# Patient Record
Sex: Female | Born: 1960 | Race: White | Hispanic: No | Smoking: Never smoker
Health system: Southern US, Community
[De-identification: ages and names within clinical notes are randomized; demographics above are authoritative.]

## PROBLEM LIST (undated history)

## (undated) DIAGNOSIS — N83209 Unspecified ovarian cyst, unspecified side: Secondary | ICD-10-CM

## (undated) DIAGNOSIS — B351 Tinea unguium: Secondary | ICD-10-CM

## (undated) DIAGNOSIS — K589 Irritable bowel syndrome without diarrhea: Secondary | ICD-10-CM

## (undated) HISTORY — DX: Tinea unguium: B35.1

## (undated) HISTORY — DX: Unspecified ovarian cyst, unspecified side: N83.209

## (undated) HISTORY — PX: BREAST BIOPSY: SHX20

## (undated) HISTORY — PX: POLYPECTOMY: SHX149

## (undated) HISTORY — DX: Irritable bowel syndrome, unspecified: K58.9

## (undated) HISTORY — PX: OVARIAN CYST SURGERY: SHX726

---

## 1997-10-07 ENCOUNTER — Inpatient Hospital Stay (HOSPITAL_COMMUNITY): Admission: AD | Admit: 1997-10-07 | Discharge: 1997-10-07 | Payer: Self-pay | Admitting: Obstetrics and Gynecology

## 1999-09-06 ENCOUNTER — Other Ambulatory Visit: Admission: RE | Admit: 1999-09-06 | Discharge: 1999-09-06 | Payer: Self-pay | Admitting: Family Medicine

## 2003-05-12 ENCOUNTER — Emergency Department (HOSPITAL_COMMUNITY): Admission: EM | Admit: 2003-05-12 | Discharge: 2003-05-12 | Payer: Self-pay | Admitting: Emergency Medicine

## 2004-01-14 ENCOUNTER — Emergency Department (HOSPITAL_COMMUNITY): Admission: EM | Admit: 2004-01-14 | Discharge: 2004-01-14 | Payer: Self-pay | Admitting: Family Medicine

## 2004-02-07 ENCOUNTER — Encounter: Payer: Self-pay | Admitting: Family Medicine

## 2005-05-09 ENCOUNTER — Other Ambulatory Visit: Admission: RE | Admit: 2005-05-09 | Discharge: 2005-05-09 | Payer: Self-pay | Admitting: Obstetrics & Gynecology

## 2008-01-11 ENCOUNTER — Encounter: Payer: Self-pay | Admitting: Family Medicine

## 2009-05-21 ENCOUNTER — Encounter (INDEPENDENT_AMBULATORY_CARE_PROVIDER_SITE_OTHER): Payer: Self-pay | Admitting: *Deleted

## 2009-06-12 ENCOUNTER — Ambulatory Visit: Payer: Self-pay | Admitting: Family Medicine

## 2009-06-12 ENCOUNTER — Other Ambulatory Visit: Admission: RE | Admit: 2009-06-12 | Discharge: 2009-06-12 | Payer: Self-pay | Admitting: Family Medicine

## 2009-06-12 DIAGNOSIS — Z8719 Personal history of other diseases of the digestive system: Secondary | ICD-10-CM | POA: Insufficient documentation

## 2009-06-12 DIAGNOSIS — N390 Urinary tract infection, site not specified: Secondary | ICD-10-CM | POA: Insufficient documentation

## 2009-06-12 DIAGNOSIS — N83209 Unspecified ovarian cyst, unspecified side: Secondary | ICD-10-CM | POA: Insufficient documentation

## 2009-06-19 ENCOUNTER — Encounter (INDEPENDENT_AMBULATORY_CARE_PROVIDER_SITE_OTHER): Payer: Self-pay | Admitting: *Deleted

## 2009-06-25 ENCOUNTER — Encounter: Admission: RE | Admit: 2009-06-25 | Discharge: 2009-06-25 | Payer: Self-pay | Admitting: Family Medicine

## 2009-06-28 ENCOUNTER — Ambulatory Visit: Payer: Self-pay | Admitting: Family Medicine

## 2009-06-29 LAB — CONVERTED CEMR LAB
ALT: 12 units/L (ref 0–35)
AST: 16 units/L (ref 0–37)
Albumin: 4.4 g/dL (ref 3.5–5.2)
Alkaline Phosphatase: 47 units/L (ref 39–117)
BUN: 8 mg/dL (ref 6–23)
Basophils Absolute: 0 10*3/uL (ref 0.0–0.1)
Basophils Relative: 0.8 % (ref 0.0–3.0)
Bilirubin, Direct: 0.1 mg/dL (ref 0.0–0.3)
CO2: 28 meq/L (ref 19–32)
Calcium: 9.2 mg/dL (ref 8.4–10.5)
Chloride: 104 meq/L (ref 96–112)
Cholesterol: 154 mg/dL (ref 0–200)
Creatinine, Ser: 0.8 mg/dL (ref 0.4–1.2)
Eosinophils Absolute: 0.1 10*3/uL (ref 0.0–0.7)
Eosinophils Relative: 2.1 % (ref 0.0–5.0)
GFR calc non Af Amer: 81.62 mL/min (ref >60)
Glucose, Bld: 77 mg/dL (ref 70–99)
HCT: 42.1 % (ref 36.0–46.0)
HDL: 49.3 mg/dL (ref >39.00)
Hemoglobin: 13.9 g/dL (ref 12.0–15.0)
LDL Cholesterol: 88 mg/dL (ref 0–99)
Lymphocytes Relative: 32.1 % (ref 12.0–46.0)
Lymphs Abs: 1.8 10*3/uL (ref 0.7–4.0)
MCHC: 33 g/dL (ref 30.0–36.0)
MCV: 89.5 fL (ref 78.0–100.0)
Monocytes Absolute: 0.4 10*3/uL (ref 0.1–1.0)
Monocytes Relative: 7.7 % (ref 3.0–12.0)
Neutro Abs: 3.4 10*3/uL (ref 1.4–7.7)
Neutrophils Relative %: 57.3 % (ref 43.0–77.0)
Platelets: 222 10*3/uL (ref 150.0–400.0)
Potassium: 4.2 meq/L (ref 3.5–5.1)
RBC: 4.71 M/uL (ref 3.87–5.11)
RDW: 12.5 % (ref 11.5–14.6)
Sodium: 138 meq/L (ref 135–145)
TSH: 1.52 microintl units/mL (ref 0.35–5.50)
Total Bilirubin: 0.7 mg/dL (ref 0.3–1.2)
Total CHOL/HDL Ratio: 3
Total Protein: 7.6 g/dL (ref 6.0–8.3)
Triglycerides: 85 mg/dL (ref 0.0–149.0)
VLDL: 17 mg/dL (ref 0.0–40.0)
WBC: 5.7 10*3/uL (ref 4.5–10.5)

## 2010-06-27 ENCOUNTER — Ambulatory Visit: Admit: 2010-06-27 | Payer: Self-pay | Admitting: Family Medicine

## 2010-07-09 NOTE — Assessment & Plan Note (Signed)
Summary: NEW TO EST,WANTS CPX,[employer] INS,NS FEE/RH.....   Vital Signs:  Patient profile:   50 year old female Height:      68.75 inches Weight:      178 pounds BMI:     26.57 Temp:     98.2 degrees F Pulse rate:   82 / minute Pulse rhythm:   regular BP sitting:   122 / 80  (left arm) Cuff size:   regular  Vitals Entered By: Army Fossa CMA (June 12, 2009 2:12 PM) CC: To establish, CPX, pap   History of Present Illness: Pt here to establish and have CPE.  Pt with hx ovarian cysts and IBS.    Preventive Screening-Counseling & Management  Alcohol-Tobacco     Alcohol drinks/day: 0     Smoking Status: never  Caffeine-Diet-Exercise     Caffeine use/day: 3     Does Patient Exercise: no  Hep-HIV-STD-Contraception     Dental Visit-last 6 months yes     Dental Care Counseling: not indicated; dental care within six months     SBE monthly: no     SBE Education/Counseling: not applicable      Drug Use:  never and no.    Current Medications (verified): 1)  Black Cohosh Root 540 Mg Caps (Black Cohosh) 2)  Coenzyme Q10 10 Mg Caps (Coenzyme Q10) 3)  Super Calcium 600 + Soy 600-200-25 Mg-Unit-Mg Tabs (Calcium Carb-Vit D-Soy Isoflav) 4)  Cranberry Plus Vitamin C 140-100-3 Mg-Mg-Unit Caps (Cranberry-Vitamin C-Vitamin E) 5)  Centrum Silver Ultra Womens  Tabs (Multiple Vitamins-Minerals) 6)  Aspir-Low 81 Mg Tbec (Aspirin) 7)  Penlac 8 % Soln (Ciclopirox) .... Use As Directed 8)  Macrobid 100 Mg Caps (Nitrofurantoin Monohyd Macro) .Marland Kitchen.. 1 By Mouth Once Daily As Needed 9)  Midol Maximum Strength 500-32.4-14.9 Mg Tabs (Aspirin-Cinnamedrine-Caffeine) 10)  Tylenol Pm Extra Strength 500-25 Mg Tabs (Diphenhydramine-Apap (Sleep))  Allergies (verified): No Known Drug Allergies  Past History:  Family History: Last updated: 06/12/2009 Galbladder cancer -M M--IBS Family History Diabetes 1st degree relative Family History of CAD Female 1st degree relative <60 Family History of CAD Female  1st degree relative <50  Social History: Last updated: 06/12/2009 Single Alcohol use-no Drug use-no Regular exercise-no Never Smoked  Risk Factors: Alcohol Use: 0 (06/12/2009) Caffeine Use: 3 (06/12/2009) Exercise: no (06/12/2009)  Past Medical History: Current Problems:  URINARY TRACT INFECTION, RECURRENT (ICD-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 Galbladder cancer -M M--IBS Family History Diabetes 1st degree relative Family History of CAD Female 1st degree relative <60 Family History of CAD Female 1st degree relative <50  Social History: Reviewed history and no changes required. Single Alcohol use-no Drug use-no Regular exercise-no Never Smoked Drug Use:  never, no Does Patient Exercise:  no Smoking Status:  never Caffeine use/day:  3 Dental Care w/in 6 mos.:  yes  Review of Systems      See HPI General:  Denies chills, fatigue, fever, loss of appetite, malaise, sleep disorder, sweats, weakness, and weight loss. Eyes:  Denies blurring, discharge, double vision, eye irritation, eye pain, halos, itching, light sensitivity, red eye, vision loss-1 eye, and vision loss-both eyes. ENT:  Denies decreased hearing, difficulty swallowing, ear discharge, earache, hoarseness, nasal  congestion, nosebleeds, postnasal drainage, ringing in ears, sinus pressure, and sore throat. CV:  Denies bluish discoloration of lips or nails, chest pain or discomfort, difficulty breathing at night, difficulty breathing while lying down, fainting, fatigue, leg cramps with exertion, lightheadness, near fainting, palpitations, shortness of breath  with exertion, swelling of feet, swelling of hands, and weight gain. Resp:  Denies chest discomfort, chest pain with inspiration, cough, coughing up blood, excessive snoring, hypersomnolence, morning headaches, pleuritic, shortness of breath, sputum productive, and wheezing. GI:  Complains of change in bowel habits; alternating constipation and diarrhea. GU:  Denies abnormal vaginal bleeding, decreased libido, discharge, dysuria, genital sores, hematuria, incontinence, nocturia, urinary frequency, and urinary hesitancy; no symptoms now but pt with frequent uti. MS:  Denies joint pain, joint redness, joint swelling, loss of strength, low back pain, mid back pain, muscle aches, muscle , cramps, muscle weakness, stiffness, and thoracic pain. Derm:  Denies changes in color of skin, changes in nail beds, dryness, excessive perspiration, flushing, hair loss, insect bite(s), itching, lesion(s), poor wound healing, and rash. Neuro:  Denies brief paralysis, difficulty with concentration, disturbances in coordination, falling down, headaches, inability to speak, memory loss, numbness, poor balance, seizures, sensation of room spinning, tingling, tremors, visual disturbances, and weakness. Psych:  Denies alternate hallucination ( auditory/visual), anxiety, depression, easily angered, easily tearful, irritability, mental problems, panic attacks, sense of great danger, suicidal thoughts/plans, thoughts of violence, unusual visions or sounds, and thoughts /plans of harming others. Endo:  Denies cold intolerance, excessive hunger, excessive thirst, excessive urination, heat intolerance, polyuria, and weight change. Heme:  Denies abnormal bruising, bleeding, enlarge lymph nodes, fevers, pallor, and skin discoloration. Allergy:  Denies hives or rash, itching eyes, persistent infections, seasonal allergies, and sneezing.  Physical Exam  General:  Well-developed,well-nourished,in no acute distress; alert,appropriate and  cooperative throughout examination Head:  Normocephalic and atraumatic without obvious abnormalities. No apparent alopecia or balding. Eyes:  vision grossly intact, pupils equal, pupils round, pupils reactive to light, and no injection.   Ears:  External ear exam shows no significant lesions or deformities.  Otoscopic examination reveals clear canals, tympanic membranes are intact bilaterally without bulging, retraction, inflammation or discharge. Hearing is grossly normal bilaterally. Nose:  External nasal examination shows no deformity or inflammation. Nasal mucosa are pink and moist without lesions or exudates. Mouth:  Oral mucosa and oropharynx without lesions or exudates.  Teeth in good repair. Neck:  No deformities, masses, or tenderness noted. Chest Wall:  No deformities, masses, or tenderness noted. Breasts:  No mass, nodules, thickening, tenderness, bulging, retraction, inflamation, nipple discharge or skin changes noted.   Lungs:  Normal respiratory effort, chest expands symmetrically. Lungs are clear to auscultation, no crackles or wheezes. Heart:  normal rate and no murmur.   Abdomen:  Bowel sounds positive,abdomen soft and non-tender without masses, organomegaly or hernias noted. Rectal:  No external abnormalities noted. Normal sphincter tone. No rectal masses or tenderness. Genitalia:  Pelvic Exam:        External: normal female genitalia without lesions or masses        Vagina: normal without lesions or masses        Cervix: normal without lesions or masses        Adnexa: normal bimanual exam without masses or fullness        Uterus: normal by palpation        Pap smear: performed Msk:  normal ROM, no joint tenderness, no joint swelling, no joint warmth, no redness over joints, no joint  deformities, no joint instability, and no crepitation.   Pulses:  R posterior tibial normal, R dorsalis pedis normal, R carotid normal, L posterior tibial normal, L dorsalis pedis normal, and L  carotid normal.   Extremities:  No clubbing, cyanosis, edema, or deformity noted with normal full range of motion of all joints.   Neurologic:  No cranial nerve deficits noted. Station and gait are normal. Plantar reflexes are down-going bilaterally. DTRs are symmetrical throughout. Sensory, motor and coordinative functions appear intact. Skin:  Intact without suspicious lesions or rashes Cervical Nodes:  No lymphadenopathy noted Axillary Nodes:  No palpable lymphadenopathy Psych:  Cognition and judgment appear intact. Alert and cooperative with normal attention span and concentration. No apparent delusions, illusions, hallucinations   Impression & Recommendations:  Problem # 1:  PREVENTIVE HEALTH CARE (ICD-V70.0) check labs pt to schedule mammo sbe  Orders: EKG w/ Interpretation (93000)  Problem # 2:  IRRITABLE BOWEL SYNDROME, HX OF (ICD-V12.79) never w/u --- pt assumed it was ibs because so many people in family have it.  Pt having no problems now advised her to try align---- consider GI if symptoms return Orders: EKG w/ Interpretation (93000)  Problem # 3:  URINARY TRACT INFECTION, RECURRENT (ICD-599.0)  pt has seen urology in past--- will get records Her updated medication list for this problem includes:    Macrobid 100 Mg Caps (Nitrofurantoin monohyd macro) .Marland Kitchen... 1 by mouth once daily as needed  Encouraged to push clear liquids, get enough rest, and take acetaminophen as needed. To be seen in 10 days if no improvement, sooner if worse.  Orders: EKG w/ Interpretation (93000)  Complete Medication List: 1)  Black Cohosh Root 540 Mg Caps (Black cohosh) 2)  Coenzyme Q10 10 Mg Caps (Coenzyme q10) 3)  Super Calcium 600 + Soy 600-200-25 Mg-unit-mg Tabs (Calcium carb-vit d-soy isoflav) 4)  Cranberry Plus Vitamin C 140-100-3 Mg-mg-unit Caps (Cranberry-vitamin c-vitamin e) 5)  Centrum Silver Ultra Womens Tabs (Multiple vitamins-minerals) 6)  Aspir-low 81 Mg Tbec (Aspirin) 7)   Penlac 8 % Soln (Ciclopirox) .... Use as directed 8)  Macrobid 100 Mg Caps (Nitrofurantoin monohyd macro) .Marland Kitchen.. 1 by mouth once daily as needed 9)  Midol Maximum Strength 500-32.4-14.9 Mg Tabs (Aspirin-cinnamedrine-caffeine) 10)  Tylenol Pm Extra Strength 500-25 Mg Tabs (Diphenhydramine-apap (sleep))  Patient Instructions: 1)  fasting labs  v70.0  cbcd, bmp, hep,lipid, tsh Prescriptions: MACROBID 100 MG CAPS (NITROFURANTOIN MONOHYD MACRO) 1 by mouth once daily as needed  #30 x 1   Entered and Authorized by:   Loreen Freud DO   Signed by:   Loreen Freud DO on 06/12/2009   Method used:   Electronically to        [facility]  [address] [id number] (ret[facility])       [address]       [place], [place]  [address]       Ph: [phone] or [phone]       Fax: [phone]   RxID:   [id number] PENLAC 8 % SOLN (CICLOPIROX) use as directed  #6.6 x 2   Entered and Authorized by:   Loreen Freud DO   Signed by:   Loreen Freud DO on 06/12/2009   Method used:   Electronically to        [facility]  [address] [id number] ([facility]       [address]       [facility] [place], [place]  [address]       Ph: [phone] or [phone]       Fax: [phone]   RxID:   [id number]    EKG  Procedure date:  06/12/2009  Findings:      Normal sinus rhythm with rate of:  69 bpm    Immunization History:  Tetanus/Td Immunization History:    Tetanus/Td:  Historical (06/23/2001)   Flu Vaccine Next Due:  Refused TD Result Date:  06/23/2001 TD Result:  given

## 2010-07-09 NOTE — Letter (Signed)
Summary: [facility]  [facility]   Imported By: Lanelle Bal 07/06/2009 10:14:39  _____________________________________________________________________  External Attachment:    Type:   Image     Comment:   External Document

## 2010-07-09 NOTE — Letter (Signed)
Summary: Results Follow up Letter  [facility]  [address] [place], [place] [address]   Phone: [phone]  Fax: [phone]    06/19/2009 MRN: [id number]  JATARA HUETTNER [address] [place], [place]  [address]  Dear Ms. TEST,  The following are the results of your recent test(s):  Test         Result    Pap Smear:        Normal __X___  Not Normal _____ Comments: ______________________________________________________ Cholesterol: LDL(Bad cholesterol):         Your goal is less than:         HDL (Good cholesterol):       Your goal is more than: Comments:  ______________________________________________________ Mammogram:        Normal _____  Not Normal _____ Comments:  ___________________________________________________________________ Hemoccult:        Normal _____  Not normal _______ Comments:    _____________________________________________________________________ Other Tests:    We routinely do not discuss normal results over the telephone.  If you desire a copy of the results, or you have any questions about this information we can discuss them at your next office visit.   Sincerely,    Army Fossa CMA  June 19, 2009 11:12 AM

## 2010-07-09 NOTE — Letter (Signed)
Summary: New Patient Letter  [facility]  [address] [place], [place] [address]   Phone: [phone]  Fax: [phone]       05/21/2009 MRN: [id number]  Emily Mejia [address] [place], [place]  [address]  Dear Ms. [name],   Welcome to [facility] and thank you for choosing [place] as your Primary Care Providers. Enclosed you will find information about our practice that we hope you find helpful. We have also enclosed forms to be filled out prior to your visit. This will provide [place] with the necessary information and facilitate your being seen in a timely manner. If you have any questions, please call us at: [phone] and we will be happy to assist you. We look forward to seeing you at your scheduled appointment time.  Appointment Tuesday, June 12, 2009 at 2:15pm  with Dr. Loreen Freud   Sincerely,  Primary Health Care Team  Please arrive 15 minutes early for your first appointment and bring your insurance card. Co-pay is required at the time of your visit.  *****Please call the office if you are not able to keep this appointment. There is a charge of $50.00 if any appointment is not cancelled or rescheduled within 24 hours*****

## 2010-07-19 ENCOUNTER — Other Ambulatory Visit: Payer: Self-pay | Admitting: Family Medicine

## 2010-07-19 ENCOUNTER — Encounter: Payer: Self-pay | Admitting: Family Medicine

## 2010-07-19 ENCOUNTER — Encounter (INDEPENDENT_AMBULATORY_CARE_PROVIDER_SITE_OTHER): Payer: BC Managed Care – PPO | Admitting: Family Medicine

## 2010-07-19 ENCOUNTER — Encounter (INDEPENDENT_AMBULATORY_CARE_PROVIDER_SITE_OTHER): Payer: Self-pay | Admitting: *Deleted

## 2010-07-19 ENCOUNTER — Other Ambulatory Visit (HOSPITAL_COMMUNITY)
Admission: RE | Admit: 2010-07-19 | Discharge: 2010-07-19 | Disposition: A | Discharge status: Home / Self Care | Payer: BC Managed Care – PPO | Source: Ambulatory Visit | Attending: Family Medicine | Admitting: Family Medicine

## 2010-07-19 DIAGNOSIS — Z0000 Encounter for general adult medical examination without abnormal findings: Secondary | ICD-10-CM

## 2010-07-19 DIAGNOSIS — Z833 Family history of diabetes mellitus: Secondary | ICD-10-CM

## 2010-07-19 DIAGNOSIS — E162 Hypoglycemia, unspecified: Secondary | ICD-10-CM

## 2010-07-19 DIAGNOSIS — Z01419 Encounter for gynecological examination (general) (routine) without abnormal findings: Secondary | ICD-10-CM | POA: Insufficient documentation

## 2010-07-19 LAB — BASIC METABOLIC PANEL
BUN: 11 mg/dL (ref 6–23)
CO2: 28 mEq/L (ref 19–32)
Calcium: 9.2 mg/dL (ref 8.4–10.5)
Chloride: 104 mEq/L (ref 96–112)
Creatinine, Ser: 0.7 mg/dL (ref 0.4–1.2)
GFR: 90.31 mL/min (ref >60.00)
Glucose, Bld: 78 mg/dL (ref 70–99)
Potassium: 4.5 mEq/L (ref 3.5–5.1)
Sodium: 139 mEq/L (ref 135–145)

## 2010-07-19 LAB — CBC WITH DIFFERENTIAL/PLATELET
Basophils Absolute: 0 10*3/uL (ref 0.0–0.1)
Basophils Relative: 0.6 % (ref 0.0–3.0)
Eosinophils Absolute: 0.1 10*3/uL (ref 0.0–0.7)
Eosinophils Relative: 1.4 % (ref 0.0–5.0)
HCT: 42.3 % (ref 36.0–46.0)
Hemoglobin: 14.6 g/dL (ref 12.0–15.0)
Lymphocytes Relative: 27.9 % (ref 12.0–46.0)
Lymphs Abs: 2.1 10*3/uL (ref 0.7–4.0)
MCHC: 34.4 g/dL (ref 30.0–36.0)
MCV: 85.6 fl (ref 78.0–100.0)
Monocytes Absolute: 0.6 10*3/uL (ref 0.1–1.0)
Monocytes Relative: 8.2 % (ref 3.0–12.0)
Neutro Abs: 4.8 10*3/uL (ref 1.4–7.7)
Neutrophils Relative %: 61.9 % (ref 43.0–77.0)
Platelets: 248 10*3/uL (ref 150.0–400.0)
RBC: 4.95 Mil/uL (ref 3.87–5.11)
RDW: 13.7 % (ref 11.5–14.6)
WBC: 7.7 10*3/uL (ref 4.5–10.5)

## 2010-07-19 LAB — LIPID PANEL
Cholesterol: 165 mg/dL (ref 0–200)
HDL: 46 mg/dL (ref >39.00)
LDL Cholesterol: 107 mg/dL — ABNORMAL HIGH (ref 0–99)
Total CHOL/HDL Ratio: 4
Triglycerides: 60 mg/dL (ref 0.0–149.0)
VLDL: 12 mg/dL (ref 0.0–40.0)

## 2010-07-19 LAB — HEPATIC FUNCTION PANEL
ALT: 12 U/L (ref 0–35)
AST: 16 U/L (ref 0–37)
Albumin: 4.2 g/dL (ref 3.5–5.2)
Alkaline Phosphatase: 45 U/L (ref 39–117)
Bilirubin, Direct: 0.1 mg/dL (ref 0.0–0.3)
Total Bilirubin: 0.7 mg/dL (ref 0.3–1.2)
Total Protein: 7.2 g/dL (ref 6.0–8.3)

## 2010-07-19 LAB — TSH: TSH: 1.19 u[IU]/mL (ref 0.35–5.50)

## 2010-07-19 LAB — HEMOGLOBIN A1C: Hgb A1c MFr Bld: 5.5 % (ref 4.6–6.5)

## 2010-07-25 NOTE — Assessment & Plan Note (Signed)
Summary: cpx&pap/rh   Vital Signs:  Patient profile:   50 year old female Menstrual status:  regular LMP:     06/27/2010 Height:      68.5 inches Weight:      192.2 pounds BMI:     28.90 Pulse rate:   71 / minute Pulse rhythm:   regular BP sitting:   132 / 81  Vitals Entered By: Almeta Monas CMA Duncan Dull) (July 19, 2010 8:27 AM) CC: Cpx/fasting with pap LMP (date): 06/27/2010     Menstrual Status regular Enter LMP: 06/27/2010 Last PAP Result NEGATIVE FOR INTRAEPITHELIAL LESIONS OR MALIGNANCY. BENIGN REACTIVE/REPARATIVE CHANGES.   History of Present Illness: Pt here for cpe,  pap and labs.   No complaints.    Preventive Screening-Counseling & Management  Alcohol-Tobacco     Alcohol drinks/day: 0     Smoking Status: never  Caffeine-Diet-Exercise     Caffeine use/day: 3     Does Patient Exercise: no     Exercise Counseling: to improve exercise regimen  Hep-HIV-STD-Contraception     Dental Visit-last 6 months yes     Dental Care Counseling: not indicated; dental care within six months     SBE monthly: no     SBE Education/Counseling: not applicable      Sexual History:  single.    Problems Prior to Update: 1)  Urinary Tract Infection, Recurrent  (ICD-599.0) 2)  Preventive Health Care  (ICD-V70.0) 3)  Family History of Cad Female 1st Degree Relative <50  (ICD-V17.3) 4)  Family History of Cad Female 1st Degree Relative <60  (ICD-V16.49) 5)  Family History Diabetes 1st Degree Relative  (ICD-V18.0) 6)  Ovarian Cyst  (ICD-620.2) 7)  Irritable Bowel Syndrome, Hx of  (ICD-V12.79)  Medications Prior to Update: 1)  Black Cohosh Root 540 Mg Caps (Black Cohosh) 2)  Coenzyme Q10 10 Mg Caps (Coenzyme Q10) 3)  Super Calcium 600 + Soy 600-200-25 Mg-Unit-Mg Tabs (Calcium Carb-Vit D-Soy Isoflav) 4)  Cranberry Plus Vitamin C 140-100-3 Mg-Mg-Unit Caps (Cranberry-Vitamin C-Vitamin E) 5)  Centrum Silver Ultra Womens  Tabs (Multiple Vitamins-Minerals) 6)  Aspir-Low 81 Mg Tbec  (Aspirin) 7)  Penlac 8 % Soln (Ciclopirox) .... Use As Directed 8)  Macrobid 100 Mg Caps (Nitrofurantoin Monohyd Macro) .Marland Kitchen.. 1 By Mouth Once Daily As Needed 9)  Midol Maximum Strength 500-32.4-14.9 Mg Tabs (Aspirin-Cinnamedrine-Caffeine) 10)  Tylenol Pm Extra Strength 500-25 Mg Tabs (Diphenhydramine-Apap (Sleep))  Current Medications (verified): 1)  Coenzyme Q10 10 Mg Caps (Coenzyme Q10) 2)  Super Calcium 600 + Soy 600-200-25 Mg-Unit-Mg Tabs (Calcium Carb-Vit D-Soy Isoflav) 3)  Cranberry Plus Vitamin C 140-100-3 Mg-Mg-Unit Caps (Cranberry-Vitamin C-Vitamin E) 4)  Centrum Silver Ultra Womens  Tabs (Multiple Vitamins-Minerals) 5)  Aspir-Low 81 Mg Tbec (Aspirin) 6)  Penlac 8 % Soln (Ciclopirox) .... Use As Directed 7)  Macrobid 100 Mg Caps (Nitrofurantoin Monohyd Macro) .Marland Kitchen.. 1 By Mouth Once Daily As Needed 8)  Vitamin D 1000 Unit Tabs (Cholecalciferol) .... By Mouth Once Daily 9)  Folic Acid 200 Mcg Tabs (Folic Acid) .... By Mouth Once Daily  Allergies (verified): No Known Drug Allergies  Past History:  Past Medical History: Last updated: 06/12/2009 Current Problems:  URINARY TRACT INFECTION, RECURRENT (ICD-599.0) PREVENTIVE HEALTH CARE (ICD-V70.0) FAMILY HISTORY OF CAD FEMALE 1ST DEGREE RELATIVE <50 (ICD-V17.3) FAMILY HISTORY OF CAD FEMALE 1ST DEGREE RELATIVE <60 (ICD-V16.49) FAMILY HISTORY DIABETES 1ST DEGREE RELATIVE (ICD-V18.0) OVARIAN CYST (ICD-620.2) IRRITABLE BOWEL SYNDROME, HX OF (ICD-V12.79)  Past Surgical History: Last updated: 06/12/2009 Ovarian cysts  Family History: Last updated: 06/12/2009 Galbladder cancer -M M--IBS Family History Diabetes 1st degree relative Family History of CAD Female 1st degree relative <60 Family History of CAD Female 1st degree relative <50  Social History: Last updated: 06/12/2009 Single Alcohol use-no Drug use-no Regular exercise-no Never Smoked  Risk Factors: Alcohol Use: 0 (07/19/2010) Caffeine Use: 3 (07/19/2010) Exercise:  no (07/19/2010)  Risk Factors: Smoking Status: never (07/19/2010)  Family History: Reviewed history from 06/12/2009 and no changes required. Galbladder cancer -M M--IBS Family History Diabetes 1st degree relative Family History of CAD Female 1st degree relative <60 Family History of CAD Female 1st degree relative <50  Social History: Reviewed history from 06/12/2009 and no changes required. Single Alcohol use-no Drug use-no Regular exercise-no Never Smoked Sexual History:  single  Review of Systems      See HPI General:  Denies chills, fatigue, fever, loss of appetite, malaise, sleep disorder, sweats, weakness, and weight loss. Eyes:  Denies blurring, discharge, double vision, eye irritation, eye pain, halos, itching, light sensitivity, red eye, vision loss-1 eye, and vision loss-both eyes; optho q2y. ENT:  Denies decreased hearing, difficulty swallowing, ear discharge, earache, hoarseness, nasal congestion, nosebleeds, postnasal drainage, ringing in ears, sinus pressure, and sore throat. CV:  Denies bluish discoloration of lips or nails, chest pain or discomfort, difficulty breathing at night, difficulty breathing while lying down, fainting, fatigue, leg cramps with exertion, lightheadness, near fainting, palpitations, shortness of breath with exertion, swelling of feet, swelling of hands, and weight gain. Resp:  Denies chest discomfort, chest pain with inspiration, cough, coughing up blood, excessive snoring, hypersomnolence, morning headaches, pleuritic, shortness of breath, sputum productive, and wheezing. GI:  Denies abdominal pain, bloody stools, change in bowel habits, constipation, dark tarry stools, diarrhea, excessive appetite, gas, hemorrhoids, indigestion, loss of appetite, nausea, vomiting, vomiting blood, and yellowish skin color. GU:  Denies abnormal vaginal bleeding, decreased libido, discharge, dysuria, genital sores, hematuria, incontinence, nocturia, urinary frequency,  and urinary hesitancy. MS:  Denies joint pain, joint redness, joint swelling, loss of strength, low back pain, mid back pain, muscle aches, muscle , cramps, muscle weakness, stiffness, and thoracic pain. Derm:  Denies changes in color of skin, changes in nail beds, dryness, excessive perspiration, flushing, hair loss, insect bite(s), itching, lesion(s), poor wound healing, and rash. Neuro:  Denies brief paralysis, difficulty with concentration, disturbances in coordination, falling down, headaches, inability to speak, memory loss, numbness, poor balance, seizures, sensation of room spinning, tingling, tremors, visual disturbances, and weakness. Psych:  Denies alternate hallucination ( auditory/visual), anxiety, depression, easily angered, easily tearful, irritability, mental problems, panic attacks, sense of great danger, suicidal thoughts/plans, thoughts of violence, unusual visions or sounds, and thoughts /plans of harming others. Endo:  Denies cold intolerance, excessive hunger, excessive thirst, excessive urination, heat intolerance, polyuria, and weight change. Heme:  Denies abnormal bruising, bleeding, enlarge lymph nodes, fevers, pallor, and skin discoloration. Allergy:  Denies hives or rash, itching eyes, persistent infections, seasonal allergies, and sneezing.  Physical Exam  General:  Well-developed,well-nourished,in no acute distress; alert,appropriate and cooperative throughout examination Head:  Normocephalic and atraumatic without obvious abnormalities. No apparent alopecia or balding. Eyes:  pupils equal, pupils round, pupils reactive to light, and no injection.   Ears:  External ear exam shows no significant lesions or deformities.  Otoscopic examination reveals clear canals, tympanic membranes are intact bilaterally without bulging, retraction, inflammation or discharge. Hearing is grossly normal bilaterally. Nose:  External nasal examination shows no deformity or inflammation. Nasal  mucosa are pink and moist  without lesions or exudates. Mouth:  Oral mucosa and oropharynx without lesions or exudates.  Teeth in good repair. Neck:  No deformities, masses, or tenderness noted.no carotid bruits.   Chest Wall:  No deformities, masses, or tenderness noted. Breasts:  No mass, nodules, thickening, tenderness, bulging, retraction, inflamation, nipple discharge or skin changes noted.   Lungs:  Normal respiratory effort, chest expands symmetrically. Lungs are clear to auscultation, no crackles or wheezes. Heart:  normal rate and no murmur.   Abdomen:  Bowel sounds positive,abdomen soft and non-tender without masses, organomegaly or hernias noted. Rectal:  No external abnormalities noted. Normal sphincter tone. No rectal masses or tenderness.  heme neg brown stool Genitalia:  Pelvic Exam:        External: normal female genitalia without lesions or masses        Vagina: normal without lesions or masses        Cervix: normal without lesions or masses        Adnexa: normal bimanual exam without masses or fullness        Uterus: normal by palpation        Pap smear: performed Msk:  normal ROM, no joint tenderness, no joint swelling, no joint warmth, no redness over joints, no joint deformities, no joint instability, and no crepitation.  normal ROM, no joint tenderness, no joint swelling, no joint warmth, no redness over joints, no joint deformities, no joint instability, and no crepitation.   Pulses:  R and L carotid,radial,femoral,dorsalis pedis and posterior tibial pulses are full and equal bilaterally Extremities:  No clubbing, cyanosis, edema, or deformity noted with normal full range of motion of all joints.   Neurologic:  No cranial nerve deficits noted. Station and gait are normal. Plantar reflexes are down-going bilaterally. DTRs are symmetrical throughout. Sensory, motor and coordinative functions appear intact. Skin:  MULT SK Cervical Nodes:  No lymphadenopathy noted Axillary  Nodes:  No palpable lymphadenopathy Psych:  Oriented X3 and normally interactive.     Impression & Recommendations:  Problem # 1:  PREVENTIVE HEALTH CARE (ICD-V70.0)  Orders: Venipuncture (08657) TLB-Lipid Panel (80061-LIPID) TLB-BMP (Basic Metabolic Panel-BMET) (80048-METABOL) TLB-CBC Platelet - w/Differential (85025-CBCD) TLB-Hepatic/Liver Function Pnl (80076-HEPATIC) TLB-TSH (Thyroid Stimulating Hormone) (84443-TSH) TLB-A1C / Hgb A1C (Glycohemoglobin) (83036-A1C) Specimen Handling (84696) EKG w/ Interpretation (93000)  Problem # 2:  URINARY TRACT INFECTION, RECURRENT (ICD-599.0)  Her updated medication list for this problem includes:    Macrobid 100 Mg Caps (Nitrofurantoin monohyd macro) .Marland Kitchen... 1 by mouth once daily as needed  Encouraged to push clear liquids, get enough rest, and take acetaminophen as needed. To be seen in 10 days if no improvement, sooner if worse.  Her updated medication list for this problem includes:    Macrobid 100 Mg Caps (Nitrofurantoin monohyd macro) .Marland Kitchen... 1 by mouth once daily as needed  Problem # 3:  FAMILY HISTORY DIABETES 1ST DEGREE RELATIVE (ICD-V18.0)  Orders: Venipuncture (29528) TLB-Lipid Panel (80061-LIPID) TLB-BMP (Basic Metabolic Panel-BMET) (80048-METABOL) TLB-CBC Platelet - w/Differential (85025-CBCD) TLB-Hepatic/Liver Function Pnl (80076-HEPATIC) TLB-TSH (Thyroid Stimulating Hormone) (84443-TSH) TLB-A1C / Hgb A1C (Glycohemoglobin) (83036-A1C) Specimen Handling (41324) EKG w/ Interpretation (93000)  Problem # 4:  HYPOGLYCEMIA (ICD-251.2)  pt instructed to either eat 5 small meals a day or eat 3 meals and 2 snacks   Orders: EKG w/ Interpretation (93000)  Complete Medication List: 1)  Coenzyme Q10 10 Mg Caps (Coenzyme q10) 2)  Super Calcium 600 + Soy 600-200-25 Mg-unit-mg Tabs (Calcium carb-vit d-soy isoflav) 3)  Cranberry Plus Vitamin C  140-100-3 Mg-mg-unit Caps (Cranberry-vitamin c-vitamin e) 4)  Centrum Silver Ultra Womens  Tabs (Multiple vitamins-minerals) 5)  Aspir-low 81 Mg Tbec (Aspirin) 6)  Penlac 8 % Soln (Ciclopirox) .... Use as directed 7)  Macrobid 100 Mg Caps (Nitrofurantoin monohyd macro) .Marland Kitchen.. 1 by mouth once daily as needed 8)  Vitamin D 1000 Unit Tabs (Cholecalciferol) .... By mouth once daily 9)  Folic Acid 200 Mcg Tabs (Folic acid) .... By mouth once daily Prescriptions: MACROBID 100 MG CAPS (NITROFURANTOIN MONOHYD MACRO) 1 by mouth once daily as needed  #30 x 1   Entered and Authorized by:   Loreen Freud DO   Signed by:   Loreen Freud DO on 07/19/2010   Method used:   Faxed to ...       [facility] (retail)       [phone] [address]       [place], [place]  [address]       Ph: [phone]       Fax: [phone]   RxID:   [id number] PENLAC 8 % SOLN (CICLOPIROX) use as directed  #6.6 x 2   Entered and Authorized by:   Loreen Freud DO   Signed by:   Loreen Freud DO on 07/19/2010   Method used:   Faxed to ...       [facility] (retail)       [phone] [address]       [place], [place]  [address]       Ph: [phone]       Fax: [phone]   RxID:   [id number] MACROBID 100 MG CAPS (NITROFURANTOIN MONOHYD MACRO) 1 by mouth once daily as needed  #30 x 1   Entered and Authorized by:   Loreen Freud DO   Signed by:   Loreen Freud DO on 07/19/2010   Method used:   Print then Give to Patient   RxID:   [id number] PENLAC 8 % SOLN (CICLOPIROX) use as directed  #6.6 x 2   Entered and Authorized by:   Loreen Freud DO   Signed by:   Loreen Freud DO on 07/19/2010   Method used:   Print then Give to Patient   RxID:   [id number]    Orders Added: 1)  Venipuncture [15176] 2)  TLB-Lipid Panel [80061-LIPID] 3)  TLB-BMP (Basic Metabolic Panel-BMET) [80048-METABOL] 4)  TLB-CBC Platelet - w/Differential [85025-CBCD] 5)  TLB-Hepatic/Liver Function Pnl [80076-HEPATIC] 6)  TLB-TSH (Thyroid Stimulating Hormone) [84443-TSH] 7)  TLB-A1C / Hgb A1C  (Glycohemoglobin) [83036-A1C] 8)  Specimen Handling [99000] 9)  Est. Patient 40-64 years [99396] 10)  EKG w/ Interpretation [93000]     EKG  Procedure date:  07/19/2010  Findings:      Normal sinus rhythm with rate of:  65   Flu Vaccine Next Due:  Refused  Appended Document: cpx&pap/rh  Laboratory Results   Urine Tests   Date/Time Reported: July 19, 2010 9:22 AM   Routine Urinalysis   Color: lt. yellow Glucose: negative   (Normal Range: Negative) Bilirubin: negative   (Normal Range: Negative) Ketone: negative   (Normal Range: Negative) Spec. Gravity: <1.005   (Normal Range: 1.003-1.035) Blood: large   (Normal Range: Negative) pH: 5.0   (Normal Range: 5.0-8.0) Protein: negative   (Normal Range: Negative) Urobilinogen: negative   (Normal Range: 0-1) Nitrite: negative   (Normal Range: Negative) Leukocyte Esterace: negative   (Normal Range: Negative)  Comments: Floydene Flock  July 19, 2010 9:22 AM no cx/ pt. had pap

## 2010-07-25 NOTE — Letter (Signed)
Summary: [facility]: Immunoassay Fecal Occult Blood (iFOB) Order Form  [facility] [place]  [address] [place], [place] [address]   Phone: [phone]  Fax: [phone]      [facility]: Immunoassay Fecal Occult Blood (iFOB) Order Form   July 19, 2010 MRN: [id number]   Emily Mejia 07-02-1960   Physicican Name:_______Dr.Lowne__________________  Diagnosis Code:________V56.71__________________      Almeta Monas CMA (AAMA)

## 2010-07-31 NOTE — Letter (Signed)
Summary: [facility] Personalized Risk Profile  [facility] Personalized Risk Profile   Imported By: Maryln Gottron 07/23/2010 10:31:12  _____________________________________________________________________  External Attachment:    Type:   Image     Comment:   External Document

## 2010-08-07 ENCOUNTER — Other Ambulatory Visit: Payer: Self-pay | Admitting: Family Medicine

## 2010-08-07 ENCOUNTER — Other Ambulatory Visit: Payer: BC Managed Care – PPO

## 2010-08-07 ENCOUNTER — Encounter (INDEPENDENT_AMBULATORY_CARE_PROVIDER_SITE_OTHER): Payer: Self-pay | Admitting: *Deleted

## 2010-08-07 DIAGNOSIS — Z1211 Encounter for screening for malignant neoplasm of colon: Secondary | ICD-10-CM

## 2010-08-08 LAB — FECAL OCCULT BLOOD, IMMUNOCHEMICAL: Fecal Occult Bld: NEGATIVE

## 2011-08-19 ENCOUNTER — Encounter: Payer: Self-pay | Admitting: Family Medicine

## 2011-08-29 ENCOUNTER — Encounter: Payer: BC Managed Care – PPO | Admitting: Family Medicine

## 2011-09-15 ENCOUNTER — Other Ambulatory Visit (HOSPITAL_COMMUNITY)
Admission: RE | Admit: 2011-09-15 | Discharge: 2011-09-15 | Disposition: A | Discharge status: Home / Self Care | Payer: BC Managed Care – PPO | Source: Ambulatory Visit | Attending: Family Medicine | Admitting: Family Medicine

## 2011-09-15 ENCOUNTER — Encounter: Payer: Self-pay | Admitting: Family Medicine

## 2011-09-15 ENCOUNTER — Ambulatory Visit (INDEPENDENT_AMBULATORY_CARE_PROVIDER_SITE_OTHER): Payer: BC Managed Care – PPO | Admitting: Family Medicine

## 2011-09-15 VITALS — BP 122/76 | HR 76 | Temp 98.3°F | Ht 68.0 in | Wt 184.6 lb

## 2011-09-15 DIAGNOSIS — Z01419 Encounter for gynecological examination (general) (routine) without abnormal findings: Secondary | ICD-10-CM | POA: Insufficient documentation

## 2011-09-15 DIAGNOSIS — Z0000 Encounter for general adult medical examination without abnormal findings: Secondary | ICD-10-CM

## 2011-09-15 DIAGNOSIS — Z124 Encounter for screening for malignant neoplasm of cervix: Secondary | ICD-10-CM

## 2011-09-15 DIAGNOSIS — N390 Urinary tract infection, site not specified: Secondary | ICD-10-CM

## 2011-09-15 DIAGNOSIS — Z23 Encounter for immunization: Secondary | ICD-10-CM

## 2011-09-15 LAB — BASIC METABOLIC PANEL
BUN: 10 mg/dL (ref 6–23)
CO2: 24 mEq/L (ref 19–32)
Calcium: 8.9 mg/dL (ref 8.4–10.5)
Chloride: 105 mEq/L (ref 96–112)
Creatinine, Ser: 0.7 mg/dL (ref 0.4–1.2)
GFR: 92.8 mL/min (ref >60.00)
Glucose, Bld: 81 mg/dL (ref 70–99)
Potassium: 3.7 mEq/L (ref 3.5–5.1)
Sodium: 139 mEq/L (ref 135–145)

## 2011-09-15 LAB — CBC WITH DIFFERENTIAL/PLATELET
Basophils Absolute: 0.1 10*3/uL (ref 0.0–0.1)
Basophils Relative: 0.7 % (ref 0.0–3.0)
Eosinophils Absolute: 0 10*3/uL (ref 0.0–0.7)
Eosinophils Relative: 0.6 % (ref 0.0–5.0)
HCT: 41 % (ref 36.0–46.0)
Hemoglobin: 13.9 g/dL (ref 12.0–15.0)
Lymphocytes Relative: 24.6 % (ref 12.0–46.0)
Lymphs Abs: 1.9 10*3/uL (ref 0.7–4.0)
MCHC: 33.9 g/dL (ref 30.0–36.0)
MCV: 85.6 fl (ref 78.0–100.0)
Monocytes Absolute: 0.5 10*3/uL (ref 0.1–1.0)
Monocytes Relative: 6.1 % (ref 3.0–12.0)
Neutro Abs: 5.4 10*3/uL (ref 1.4–7.7)
Neutrophils Relative %: 68 % (ref 43.0–77.0)
Platelets: 246 10*3/uL (ref 150.0–400.0)
RBC: 4.78 Mil/uL (ref 3.87–5.11)
RDW: 13.3 % (ref 11.5–14.6)
WBC: 7.9 10*3/uL (ref 4.5–10.5)

## 2011-09-15 LAB — POCT URINALYSIS DIPSTICK
Bilirubin, UA: NEGATIVE
Glucose, UA: NEGATIVE
Ketones, UA: NEGATIVE
Leukocytes, UA: NEGATIVE
Nitrite, UA: NEGATIVE
Protein, UA: NEGATIVE
Spec Grav, UA: 1.01
Urobilinogen, UA: 0.2
pH, UA: 5

## 2011-09-15 LAB — LIPID PANEL
Cholesterol: 169 mg/dL (ref 0–200)
HDL: 53.9 mg/dL (ref >39.00)
LDL Cholesterol: 104 mg/dL — ABNORMAL HIGH (ref 0–99)
Total CHOL/HDL Ratio: 3
Triglycerides: 58 mg/dL (ref 0.0–149.0)
VLDL: 11.6 mg/dL (ref 0.0–40.0)

## 2011-09-15 LAB — HEPATIC FUNCTION PANEL
ALT: 12 U/L (ref 0–35)
AST: 17 U/L (ref 0–37)
Albumin: 4.4 g/dL (ref 3.5–5.2)
Alkaline Phosphatase: 44 U/L (ref 39–117)
Bilirubin, Direct: 0 mg/dL (ref 0.0–0.3)
Total Bilirubin: 0.3 mg/dL (ref 0.3–1.2)
Total Protein: 7.4 g/dL (ref 6.0–8.3)

## 2011-09-15 LAB — TSH: TSH: 1.28 u[IU]/mL (ref 0.35–5.50)

## 2011-09-15 MED ORDER — NITROFURANTOIN MONOHYD MACRO 100 MG PO CAPS: 100.0000 mg | ORAL_CAPSULE | ORAL | Status: DC | PRN

## 2011-09-15 NOTE — Progress Notes (Signed)
Addended by: Arnette Norris on: 09/15/2011 11:55 AM   Modules accepted: Orders

## 2011-09-15 NOTE — Progress Notes (Signed)
Subjective:     Emily Mejia is a 51 y.o. female and is here for a comprehensive physical exam. The patient reports no problems.  History   Social History  . Marital Status: Divorced    Spouse Name: N/A    Number of Children: N/A  . Years of Education: N/A   Occupational History  . Not on file.   Social History Main Topics  . Smoking status: Never Smoker   . Smokeless tobacco: Never Used  . Alcohol Use: No  . Drug Use: No  . Sexually Active: Not on file   Other Topics Concern  . Not on file   Social History Narrative  . No narrative on file   Health Maintenance  Topic Date Due  . Tetanus/tdap  06/24/2011  . Influenza Vaccine  03/09/2012  . Pap Smear  07/19/2013    The following portions of the patient's history were reviewed and updated as appropriate: allergies, current medications, past family history, past medical history, past social history, past surgical history and problem list.  Review of Systems Review of Systems  Constitutional: Negative for activity change, appetite change and fatigue.  HENT: Negative for hearing loss, congestion, tinnitus and ear discharge.  dentist q19m Eyes: Negative for visual disturbance (see optho q1y -- vision corrected to 20/20 with glasses).  Respiratory: Negative for cough, chest tightness and shortness of breath.   Cardiovascular: Negative for chest pain, palpitations and leg swelling.  Gastrointestinal: Negative for abdominal pain, diarrhea, constipation and abdominal distention.  Genitourinary: Negative for urgency, frequency, decreased urine volume and difficulty urinating.  Musculoskeletal: Negative for back pain, arthralgias and gait problem.  Skin: Negative for color change, pallor and rash.  Neurological: Negative for dizziness, light-headedness, numbness and headaches.  Hematological: Negative for adenopathy. Does not bruise/bleed easily.  Psychiatric/Behavioral: Negative for suicidal ideas, confusion, sleep  disturbance, self-injury, dysphoric mood, decreased concentration and agitation.       Objective:    BP 122/76  Pulse 76  Temp(Src) 98.3 F (36.8 C) (Oral)  Ht 5\' 8"  (1.727 m)  Wt 184 lb 9.6 oz (83.734 kg)  BMI 28.07 kg/m2  SpO2 98%  LMP 08/28/2011 General appearance: alert, cooperative, appears stated age and no distress Head: Normocephalic, without obvious abnormality, atraumatic Eyes: conjunctivae/corneas clear. PERRL, EOM&#39;s intact. Fundi benign. Ears: normal TM's and external ear canals both ears Nose: Nares normal. Septum midline. Mucosa normal. No drainage or sinus tenderness. Throat: lips, mucosa, and tongue normal; teeth and gums normal Neck: no adenopathy, no carotid bruit, no JVD, supple, symmetrical, trachea midline and thyroid not enlarged, symmetric, no tenderness/mass/nodules Back: symmetric, no curvature. ROM normal. No CVA tenderness. Lungs: clear to auscultation bilaterally Breasts: normal appearance, no masses or tenderness Heart: regular rate and rhythm, S1, S2 normal, no murmur, click, rub or gallop Abdomen: soft, non-tender; bowel sounds normal; no masses,  no organomegaly Pelvic: cervix normal in appearance, external genitalia normal, no adnexal masses or tenderness, no cervical motion tenderness, rectovaginal septum normal, uterus normal size, shape, and consistency and vagina normal without discharge---2 dark spots on cervix--? hemangiomas Extremities: extremities normal, atraumatic, no cyanosis or edema Pulses: 2+ and symmetric Skin: Skin color, texture, turgor normal. No rashes or lesions Lymph nodes: Cervical, supraclavicular, and axillary nodes normal. Neurologic: Alert and oriented X 3, normal strength and tone. Normal symmetric reflexes. Normal coordination and gait psych-- no depression / anxiety    Assessment:    Healthy female exam.     recurrent uti-- has had w/u with  urology -- has macrobid for prn use Plan:  ghm utd  Check labs See  After Visit Summary for Counseling Recommendations

## 2011-09-15 NOTE — Patient Instructions (Signed)
Preventive Care for Adults, Female A healthy lifestyle and preventive care can promote health and wellness. Preventive health guidelines for women include the following key practices.  A routine yearly physical is a good way to check with your caregiver about your health and preventive screening. It is a chance to share any concerns and updates on your health, and to receive a thorough exam.   Visit your dentist for a routine exam and preventive care every 6 months. Brush your teeth twice a day and floss once a day. Good oral hygiene prevents tooth decay and gum disease.   The frequency of eye exams is based on your age, health, family medical history, use of contact lenses, and other factors. Follow your caregiver's recommendations for frequency of eye exams.   Eat a healthy diet. Foods like vegetables, fruits, whole grains, low-fat dairy products, and lean protein foods contain the nutrients you need without too many calories. Decrease your intake of foods high in solid fats, added sugars, and salt. Eat the right amount of calories for you.Get information about a proper diet from your caregiver, if necessary.   Regular physical exercise is one of the most important things you can do for your health. Most adults should get at least 150 minutes of moderate-intensity exercise (any activity that increases your heart rate and causes you to sweat) each week. In addition, most adults need muscle-strengthening exercises on 2 or more days a week.   Maintain a healthy weight. The body mass index (BMI) is a screening tool to identify possible weight problems. It provides an estimate of body fat based on height and weight. Your caregiver can help determine your BMI, and can help you achieve or maintain a healthy weight.For adults 20 years and older:   A BMI below 18.5 is considered underweight.   A BMI of 18.5 to 24.9 is normal.   A BMI of 25 to 29.9 is considered overweight.   A BMI of 30 and above is  considered obese.   Maintain normal blood lipids and cholesterol levels by exercising and minimizing your intake of saturated fat. Eat a balanced diet with plenty of fruit and vegetables. Blood tests for lipids and cholesterol should begin at age 20 and be repeated every 5 years. If your lipid or cholesterol levels are high, you are over 50, or you are at high risk for heart disease, you may need your cholesterol levels checked more frequently.Ongoing high lipid and cholesterol levels should be treated with medicines if diet and exercise are not effective.   If you smoke, find out from your caregiver how to quit. If you do not use tobacco, do not start.   If you are pregnant, do not drink alcohol. If you are breastfeeding, be very cautious about drinking alcohol. If you are not pregnant and choose to drink alcohol, do not exceed 1 drink per day. One drink is considered to be 12 ounces (355 mL) of beer, 5 ounces (148 mL) of wine, or 1.5 ounces (44 mL) of liquor.   Avoid use of street drugs. Do not share needles with anyone. Ask for help if you need support or instructions about stopping the use of drugs.   High blood pressure causes heart disease and increases the risk of stroke. Your blood pressure should be checked at least every 1 to 2 years. Ongoing high blood pressure should be treated with medicines if weight loss and exercise are not effective.   If you are 55 to 50   years old, ask your caregiver if you should take aspirin to prevent strokes.   Diabetes screening involves taking a blood sample to check your fasting blood sugar level. This should be done once every 3 years, after age 45, if you are within normal weight and without risk factors for diabetes. Testing should be considered at a younger age or be carried out more frequently if you are overweight and have at least 1 risk factor for diabetes.   Breast cancer screening is essential preventive care for women. You should practice "breast  self-awareness." This means understanding the normal appearance and feel of your breasts and may include breast self-examination. Any changes detected, no matter how small, should be reported to a caregiver. Women in their 20s and 30s should have a clinical breast exam (CBE) by a caregiver as part of a regular health exam every 1 to 3 years. After age 40, women should have a CBE every year. Starting at age 40, women should consider having a mammography (breast X-ray test) every year. Women who have a family history of breast cancer should talk to their caregiver about genetic screening. Women at a high risk of breast cancer should talk to their caregivers about having magnetic resonance imaging (MRI) and a mammography every year.   The Pap test is a screening test for cervical cancer. A Pap test can show cell changes on the cervix that might become cervical cancer if left untreated. A Pap test is a procedure in which cells are obtained and examined from the lower end of the uterus (cervix).   Women should have a Pap test starting at age 21.   Between ages 21 and 29, Pap tests should be repeated every 2 years.   Beginning at age 30, you should have a Pap test every 3 years as long as the past 3 Pap tests have been normal.   Some women have medical problems that increase the chance of getting cervical cancer. Talk to your caregiver about these problems. It is especially important to talk to your caregiver if a new problem develops soon after your last Pap test. In these cases, your caregiver may recommend more frequent screening and Pap tests.   The above recommendations are the same for women who have or have not gotten the vaccine for human papillomavirus (HPV).   If you had a hysterectomy for a problem that was not cancer or a condition that could lead to cancer, then you no longer need Pap tests. Even if you no longer need a Pap test, a regular exam is a good idea to make sure no other problems are  starting.   If you are between ages 65 and 70, and you have had normal Pap tests going back 10 years, you no longer need Pap tests. Even if you no longer need a Pap test, a regular exam is a good idea to make sure no other problems are starting.   If you have had past treatment for cervical cancer or a condition that could lead to cancer, you need Pap tests and screening for cancer for at least 20 years after your treatment.   If Pap tests have been discontinued, risk factors (such as a new sexual partner) need to be reassessed to determine if screening should be resumed.   The HPV test is an additional test that may be used for cervical cancer screening. The HPV test looks for the virus that can cause the cell changes on the cervix.   The cells collected during the Pap test can be tested for HPV. The HPV test could be used to screen women aged 30 years and older, and should be used in women of any age who have unclear Pap test results. After the age of 30, women should have HPV testing at the same frequency as a Pap test.   Colorectal cancer can be detected and often prevented. Most routine colorectal cancer screening begins at the age of 50 and continues through age 75. However, your caregiver may recommend screening at an earlier age if you have risk factors for colon cancer. On a yearly basis, your caregiver may provide home test kits to check for hidden blood in the stool. Use of a small camera at the end of a tube, to directly examine the colon (sigmoidoscopy or colonoscopy), can detect the earliest forms of colorectal cancer. Talk to your caregiver about this at age 50, when routine screening begins. Direct examination of the colon should be repeated every 5 to 10 years through age 75, unless early forms of pre-cancerous polyps or small growths are found.   Hepatitis C blood testing is recommended for all people born from 1945 through 1965 and any individual with known risks for hepatitis C.    Practice safe sex. Use condoms and avoid high-risk sexual practices to reduce the spread of sexually transmitted infections (STIs). STIs include gonorrhea, chlamydia, syphilis, trichomonas, herpes, HPV, and human immunodeficiency virus (HIV). Herpes, HIV, and HPV are viral illnesses that have no cure. They can result in disability, cancer, and death. Sexually active women aged 25 and younger should be checked for chlamydia. Older women with new or multiple partners should also be tested for chlamydia. Testing for other STIs is recommended if you are sexually active and at increased risk.   Osteoporosis is a disease in which the bones lose minerals and strength with aging. This can result in serious bone fractures. The risk of osteoporosis can be identified using a bone density scan. Women ages 65 and over and women at risk for fractures or osteoporosis should discuss screening with their caregivers. Ask your caregiver whether you should take a calcium supplement or vitamin D to reduce the rate of osteoporosis.   Menopause can be associated with physical symptoms and risks. Hormone replacement therapy is available to decrease symptoms and risks. You should talk to your caregiver about whether hormone replacement therapy is right for you.   Use sunscreen with sun protection factor (SPF) of 30 or more. Apply sunscreen liberally and repeatedly throughout the day. You should seek shade when your shadow is shorter than you. Protect yourself by wearing long sleeves, pants, a wide-brimmed hat, and sunglasses year round, whenever you are outdoors.   Once a month, do a whole body skin exam, using a mirror to look at the skin on your back. Notify your caregiver of new moles, moles that have irregular borders, moles that are larger than a pencil eraser, or moles that have changed in shape or color.   Stay current with required immunizations.   Influenza. You need a dose every fall (or winter). The composition of  the flu vaccine changes each year, so being vaccinated once is not enough.   Pneumococcal polysaccharide. You need 1 to 2 doses if you smoke cigarettes or if you have certain chronic medical conditions. You need 1 dose at age 65 (or older) if you have never been vaccinated.   Tetanus, diphtheria, pertussis (Tdap, Td). Get 1 dose of   Tdap vaccine if you are younger than age 65, are over 65 and have contact with an infant, are a healthcare worker, are pregnant, or simply want to be protected from whooping cough. After that, you need a Td booster dose every 10 years. Consult your caregiver if you have not had at least 3 tetanus and diphtheria-containing shots sometime in your life or have a deep or dirty wound.   HPV. You need this vaccine if you are a woman age 26 or younger. The vaccine is given in 3 doses over 6 months.   Measles, mumps, rubella (MMR). You need at least 1 dose of MMR if you were born in 1957 or later. You may also need a second dose.   Meningococcal. If you are age 19 to 21 and a first-year college student living in a residence hall, or have one of several medical conditions, you need to get vaccinated against meningococcal disease. You may also need additional booster doses.   Zoster (shingles). If you are age 60 or older, you should get this vaccine.   Varicella (chickenpox). If you have never had chickenpox or you were vaccinated but received only 1 dose, talk to your caregiver to find out if you need this vaccine.   Hepatitis A. You need this vaccine if you have a specific risk factor for hepatitis A virus infection or you simply wish to be protected from this disease. The vaccine is usually given as 2 doses, 6 to 18 months apart.   Hepatitis B. You need this vaccine if you have a specific risk factor for hepatitis B virus infection or you simply wish to be protected from this disease. The vaccine is given in 3 doses, usually over 6 months.  Preventive Services /  Frequency Ages 19 to 39  Blood pressure check.** / Every 1 to 2 years.   Lipid and cholesterol check.** / Every 5 years beginning at age 20.   Clinical breast exam.** / Every 3 years for women in their 20s and 30s.   Pap test.** / Every 2 years from ages 21 through 29. Every 3 years starting at age 30 through age 65 or 70 with a history of 3 consecutive normal Pap tests.   HPV screening.** / Every 3 years from ages 30 through ages 65 to 70 with a history of 3 consecutive normal Pap tests.   Hepatitis C blood test.** / For any individual with known risks for hepatitis C.   Skin self-exam. / Monthly.   Influenza immunization.** / Every year.   Pneumococcal polysaccharide immunization.** / 1 to 2 doses if you smoke cigarettes or if you have certain chronic medical conditions.   Tetanus, diphtheria, pertussis (Tdap, Td) immunization. / A one-time dose of Tdap vaccine. After that, you need a Td booster dose every 10 years.   HPV immunization. / 3 doses over 6 months, if you are 26 and younger.   Measles, mumps, rubella (MMR) immunization. / You need at least 1 dose of MMR if you were born in 1957 or later. You may also need a second dose.   Meningococcal immunization. / 1 dose if you are age 19 to 21 and a first-year college student living in a residence hall, or have one of several medical conditions, you need to get vaccinated against meningococcal disease. You may also need additional booster doses.   Varicella immunization.** / Consult your caregiver.   Hepatitis A immunization.** / Consult your caregiver. 2 doses, 6 to 18 months   apart.   Hepatitis B immunization.** / Consult your caregiver. 3 doses usually over 6 months.  Ages 40 to 64  Blood pressure check.** / Every 1 to 2 years.   Lipid and cholesterol check.** / Every 5 years beginning at age 20.   Clinical breast exam.** / Every year after age 40.   Mammogram.** / Every year beginning at age 40 and continuing for as  long as you are in good health. Consult with your caregiver.   Pap test.** / Every 3 years starting at age 30 through age 65 or 70 with a history of 3 consecutive normal Pap tests.   HPV screening.** / Every 3 years from ages 30 through ages 65 to 70 with a history of 3 consecutive normal Pap tests.   Fecal occult blood test (FOBT) of stool. / Every year beginning at age 50 and continuing until age 75. You may not need to do this test if you get a colonoscopy every 10 years.   Flexible sigmoidoscopy or colonoscopy.** / Every 5 years for a flexible sigmoidoscopy or every 10 years for a colonoscopy beginning at age 50 and continuing until age 75.   Hepatitis C blood test.** / For all people born from 1945 through 1965 and any individual with known risks for hepatitis C.   Skin self-exam. / Monthly.   Influenza immunization.** / Every year.   Pneumococcal polysaccharide immunization.** / 1 to 2 doses if you smoke cigarettes or if you have certain chronic medical conditions.   Tetanus, diphtheria, pertussis (Tdap, Td) immunization.** / A one-time dose of Tdap vaccine. After that, you need a Td booster dose every 10 years.   Measles, mumps, rubella (MMR) immunization. / You need at least 1 dose of MMR if you were born in 1957 or later. You may also need a second dose.   Varicella immunization.** / Consult your caregiver.   Meningococcal immunization.** / Consult your caregiver.   Hepatitis A immunization.** / Consult your caregiver. 2 doses, 6 to 18 months apart.   Hepatitis B immunization.** / Consult your caregiver. 3 doses, usually over 6 months.  Ages 65 and over  Blood pressure check.** / Every 1 to 2 years.   Lipid and cholesterol check.** / Every 5 years beginning at age 20.   Clinical breast exam.** / Every year after age 40.   Mammogram.** / Every year beginning at age 40 and continuing for as long as you are in good health. Consult with your caregiver.   Pap test.** /  Every 3 years starting at age 30 through age 65 or 70 with a 3 consecutive normal Pap tests. Testing can be stopped between 65 and 70 with 3 consecutive normal Pap tests and no abnormal Pap or HPV tests in the past 10 years.   HPV screening.** / Every 3 years from ages 30 through ages 65 or 70 with a history of 3 consecutive normal Pap tests. Testing can be stopped between 65 and 70 with 3 consecutive normal Pap tests and no abnormal Pap or HPV tests in the past 10 years.   Fecal occult blood test (FOBT) of stool. / Every year beginning at age 50 and continuing until age 75. You may not need to do this test if you get a colonoscopy every 10 years.   Flexible sigmoidoscopy or colonoscopy.** / Every 5 years for a flexible sigmoidoscopy or every 10 years for a colonoscopy beginning at age 50 and continuing until age 75.   Hepatitis   C blood test.** / For all people born from 1945 through 1965 and any individual with known risks for hepatitis C.   Osteoporosis screening.** / A one-time screening for women ages 65 and over and women at risk for fractures or osteoporosis.   Skin self-exam. / Monthly.   Influenza immunization.** / Every year.   Pneumococcal polysaccharide immunization.** / 1 dose at age 65 (or older) if you have never been vaccinated.   Tetanus, diphtheria, pertussis (Tdap, Td) immunization. / A one-time dose of Tdap vaccine if you are over 65 and have contact with an infant, are a healthcare worker, or simply want to be protected from whooping cough. After that, you need a Td booster dose every 10 years.   Varicella immunization.** / Consult your caregiver.   Meningococcal immunization.** / Consult your caregiver.   Hepatitis A immunization.** / Consult your caregiver. 2 doses, 6 to 18 months apart.   Hepatitis B immunization.** / Check with your caregiver. 3 doses, usually over 6 months.  ** Family history and personal history of risk and conditions may change your caregiver's  recommendations. Document Released: 07/22/2001 Document Revised: 05/15/2011 Document Reviewed: 10/21/2010 [employer] Patient Information 2012 [employer]. 

## 2011-09-15 NOTE — Progress Notes (Signed)
Addended by: Arnette Norris on: 09/15/2011 11:29 AM   Modules accepted: Orders

## 2012-07-24 ENCOUNTER — Other Ambulatory Visit: Payer: Self-pay

## 2012-09-17 ENCOUNTER — Other Ambulatory Visit (HOSPITAL_COMMUNITY)
Admission: RE | Admit: 2012-09-17 | Discharge: 2012-09-17 | Disposition: A | Discharge status: Home / Self Care | Payer: BC Managed Care – PPO | Source: Ambulatory Visit | Attending: Family Medicine | Admitting: Family Medicine

## 2012-09-17 ENCOUNTER — Encounter: Payer: Self-pay | Admitting: Family Medicine

## 2012-09-17 ENCOUNTER — Telehealth: Payer: Self-pay | Admitting: Family Medicine

## 2012-09-17 ENCOUNTER — Ambulatory Visit (INDEPENDENT_AMBULATORY_CARE_PROVIDER_SITE_OTHER): Payer: BC Managed Care – PPO | Admitting: Family Medicine

## 2012-09-17 VITALS — BP 120/76 | HR 65 | Temp 98.1°F | Ht 68.0 in | Wt 189.4 lb

## 2012-09-17 DIAGNOSIS — Z0000 Encounter for general adult medical examination without abnormal findings: Secondary | ICD-10-CM

## 2012-09-17 DIAGNOSIS — Z1151 Encounter for screening for human papillomavirus (HPV): Secondary | ICD-10-CM | POA: Insufficient documentation

## 2012-09-17 DIAGNOSIS — Z124 Encounter for screening for malignant neoplasm of cervix: Secondary | ICD-10-CM

## 2012-09-17 DIAGNOSIS — B351 Tinea unguium: Secondary | ICD-10-CM

## 2012-09-17 DIAGNOSIS — Z01419 Encounter for gynecological examination (general) (routine) without abnormal findings: Secondary | ICD-10-CM | POA: Insufficient documentation

## 2012-09-17 DIAGNOSIS — N390 Urinary tract infection, site not specified: Secondary | ICD-10-CM

## 2012-09-17 LAB — CBC WITH DIFFERENTIAL/PLATELET
Basophils Absolute: 0.1 10*3/uL (ref 0.0–0.1)
Basophils Relative: 0.9 % (ref 0.0–3.0)
Eosinophils Absolute: 0.1 10*3/uL (ref 0.0–0.7)
Eosinophils Relative: 1.2 % (ref 0.0–5.0)
HCT: 40.3 % (ref 36.0–46.0)
Hemoglobin: 13.7 g/dL (ref 12.0–15.0)
Lymphocytes Relative: 31.1 % (ref 12.0–46.0)
Lymphs Abs: 2 10*3/uL (ref 0.7–4.0)
MCHC: 33.9 g/dL (ref 30.0–36.0)
MCV: 83.8 fl (ref 78.0–100.0)
Monocytes Absolute: 0.5 10*3/uL (ref 0.1–1.0)
Monocytes Relative: 7.4 % (ref 3.0–12.0)
Neutro Abs: 3.9 10*3/uL (ref 1.4–7.7)
Neutrophils Relative %: 59.4 % (ref 43.0–77.0)
Platelets: 248 10*3/uL (ref 150.0–400.0)
RBC: 4.81 Mil/uL (ref 3.87–5.11)
RDW: 13.7 % (ref 11.5–14.6)
WBC: 6.5 10*3/uL (ref 4.5–10.5)

## 2012-09-17 LAB — BASIC METABOLIC PANEL
BUN: 12 mg/dL (ref 6–23)
CO2: 28 mEq/L (ref 19–32)
Calcium: 8.8 mg/dL (ref 8.4–10.5)
Chloride: 103 mEq/L (ref 96–112)
Creatinine, Ser: 0.9 mg/dL (ref 0.4–1.2)
GFR: 75.09 mL/min (ref >60.00)
Glucose, Bld: 87 mg/dL (ref 70–99)
Potassium: 3.8 mEq/L (ref 3.5–5.1)
Sodium: 136 mEq/L (ref 135–145)

## 2012-09-17 LAB — LIPID PANEL
Cholesterol: 159 mg/dL (ref 0–200)
HDL: 43.8 mg/dL (ref >39.00)
LDL Cholesterol: 101 mg/dL — ABNORMAL HIGH (ref 0–99)
Total CHOL/HDL Ratio: 4
Triglycerides: 70 mg/dL (ref 0.0–149.0)
VLDL: 14 mg/dL (ref 0.0–40.0)

## 2012-09-17 LAB — TSH: TSH: 1.16 u[IU]/mL (ref 0.35–5.50)

## 2012-09-17 LAB — HEPATIC FUNCTION PANEL
ALT: 15 U/L (ref 0–35)
AST: 16 U/L (ref 0–37)
Albumin: 4.3 g/dL (ref 3.5–5.2)
Alkaline Phosphatase: 45 U/L (ref 39–117)
Bilirubin, Direct: 0 mg/dL (ref 0.0–0.3)
Total Bilirubin: 0.3 mg/dL (ref 0.3–1.2)
Total Protein: 7.4 g/dL (ref 6.0–8.3)

## 2012-09-17 MED ORDER — NITROFURANTOIN MONOHYD MACRO 100 MG PO CAPS: 100.0000 mg | ORAL_CAPSULE | ORAL | Status: DC | PRN

## 2012-09-17 MED ORDER — CICLOPIROX 8 % EX SOLN: Freq: Every day | CUTANEOUS | Status: DC

## 2012-09-17 NOTE — Telephone Encounter (Signed)
A referral was put in today during the office visit, to Renee to make her aware of patient request below.    KP

## 2012-09-17 NOTE — Progress Notes (Signed)
Subjective:     Emily Mejia is a 52 y.o. female and is here for a comprehensive physical exam. The patient reports no problems.  History   Social History  . Marital Status: Divorced    Spouse Name: N/A    Number of Children: N/A  . Years of Education: N/A   Occupational History  . [employer]-- books    Social History Main Topics  . Smoking status: Never Smoker   . Smokeless tobacco: Never Used  . Alcohol Use: No  . Drug Use: No  . Sexually Active: Yes -- Female partner(s)   Other Topics Concern  . Not on file   Social History Narrative   Exercise--- walks    Health Maintenance  Topic Date Due  . Colonoscopy  03/14/2012  . Mammogram  08/12/2012  . Influenza Vaccine  02/07/2013  . Pap Smear  09/15/2014  . Tetanus/tdap  09/14/2021    The following portions of the patient's history were reviewed and updated as appropriate:  She  has a past medical history of Ovarian cyst and IBS (irritable bowel syndrome). She  does not have any pertinent problems on file. She  has past surgical history that includes Ovarian cyst surgery. Her family history includes Cancer in her mother; Cancer (age of onset: 68) in her brother; Coronary artery disease in an unspecified family member; Diabetes in her father and mother; Gallbladder disease in her mother; Hyperlipidemia in her father and mother; Hypertension in her father and mother; and Irritable bowel syndrome in her mother. She  reports that she has never smoked. She has never used smokeless tobacco. She reports that she does not drink alcohol or use illicit drugs. She has a current medication list which includes the following prescription(s): aspirin, calcium carbonate, cholecalciferol, co-enzyme q-10, cranberry, folic acid, ciclopirox, and nitrofurantoin (macrocrystal-monohydrate). Current Outpatient Prescriptions on File Prior to Visit  Medication Sig Dispense Refill  . aspirin 81 MG tablet Take 81 mg by mouth daily.      .  calcium carbonate (OS-CAL) 600 MG TABS Take 600 mg by mouth daily.      . cholecalciferol (VITAMIN D) 1000 UNITS tablet Take 1,000 Units by mouth daily.      Marland Kitchen co-enzyme Q-10 30 MG capsule Take 30 mg by mouth 3 (three) times daily.      . Cranberry 140-100-3 MG-MG-UNIT CAPS Take 1 capsule by mouth daily.      . Folic Acid 200 MCG TABS Take 1 tablet by mouth daily.       No current facility-administered medications on file prior to visit.   She has No Known Allergies..  Review of Systems Review of Systems  Constitutional: Negative for activity change, appetite change and fatigue.  HENT: Negative for hearing loss, congestion, tinnitus and ear discharge.  dentist q76m Eyes: Negative for visual disturbance (see optho q1y -- vision corrected to 20/20 with glasses).  Respiratory: Negative for cough, chest tightness and shortness of breath.   Cardiovascular: Negative for chest pain, palpitations and leg swelling.  Gastrointestinal: Negative for abdominal pain, diarrhea, constipation and abdominal distention.  Genitourinary: Negative for urgency, frequency, decreased urine volume and difficulty urinating.  Musculoskeletal: Negative for back pain, arthralgias and gait problem.  Skin: Negative for color change, pallor and rash.  Neurological: Negative for dizziness, light-headedness, numbness and headaches.  Hematological: Negative for adenopathy. Does not bruise/bleed easily.  Psychiatric/Behavioral: Negative for suicidal ideas, confusion, sleep disturbance, self-injury, dysphoric mood, decreased concentration and agitation.  Objective:    BP 120/76  Pulse 65  Temp(Src) 98.1 F (36.7 C) (Oral)  Ht 5\' 8"  (1.727 m)  Wt 189 lb 6.4 oz (85.911 kg)  BMI 28.8 kg/m2  SpO2 98%  LMP 09/17/2012 General appearance: alert, cooperative, appears stated age and no distress Head: Normocephalic, without obvious abnormality, atraumatic Eyes: conjunctivae/corneas clear. PERRL, EOM's intact. Fundi  benign. Ears: normal TM's and external ear canals both ears Nose: Nares normal. Septum midline. Mucosa normal. No drainage or sinus tenderness. Throat: lips, mucosa, and tongue normal; teeth and gums normal Neck: no adenopathy, no carotid bruit, no JVD, supple, symmetrical, trachea midline and thyroid not enlarged, symmetric, no tenderness/mass/nodules Back: symmetric, no curvature. ROM normal. No CVA tenderness. Lungs: clear to auscultation bilaterally Breasts: gyn Heart: S1, S2 normal Abdomen: soft, non-tender; bowel sounds normal; no masses,  no organomegaly Pelvic: deferred--gyn Extremities: extremities normal, atraumatic, no cyanosis or edema Pulses: 2+ and symmetric Skin: Skin color, texture, turgor normal. No rashes or lesions Lymph nodes: Cervical, supraclavicular, and axillary nodes normal. Neurologic: Alert and oriented X 3, normal strength and tone. Normal symmetric reflexes. Normal coordination and gait    Assessment:    Healthy female exam.      Plan:    ghm utd Check labs See After Visit Summary for Counseling Recommendations

## 2012-09-17 NOTE — Patient Instructions (Addendum)
Preventive Care for Adults, Female A healthy lifestyle and preventive care can promote health and wellness. Preventive health guidelines for women include the following key practices.  A routine yearly physical is a good way to check with your caregiver about your health and preventive screening. It is a chance to share any concerns and updates on your health, and to receive a thorough exam.  Visit your dentist for a routine exam and preventive care every 6 months. Brush your teeth twice a day and floss once a day. Good oral hygiene prevents tooth decay and gum disease.  The frequency of eye exams is based on your age, health, family medical history, use of contact lenses, and other factors. Follow your caregiver's recommendations for frequency of eye exams.  Eat a healthy diet. Foods like vegetables, fruits, whole grains, low-fat dairy products, and lean protein foods contain the nutrients you need without too many calories. Decrease your intake of foods high in solid fats, added sugars, and salt. Eat the right amount of calories for you.Get information about a proper diet from your caregiver, if necessary.  Regular physical exercise is one of the most important things you can do for your health. Most adults should get at least 150 minutes of moderate-intensity exercise (any activity that increases your heart rate and causes you to sweat) each week. In addition, most adults need muscle-strengthening exercises on 2 or more days a week.  Maintain a healthy weight. The body mass index (BMI) is a screening tool to identify possible weight problems. It provides an estimate of body fat based on height and weight. Your caregiver can help determine your BMI, and can help you achieve or maintain a healthy weight.For adults 20 years and older:  A BMI below 18.5 is considered underweight.  A BMI of 18.5 to 24.9 is normal.  A BMI of 25 to 29.9 is considered overweight.  A BMI of 30 and above is  considered obese.  Maintain normal blood lipids and cholesterol levels by exercising and minimizing your intake of saturated fat. Eat a balanced diet with plenty of fruit and vegetables. Blood tests for lipids and cholesterol should begin at age 20 and be repeated every 5 years. If your lipid or cholesterol levels are high, you are over 50, or you are at high risk for heart disease, you may need your cholesterol levels checked more frequently.Ongoing high lipid and cholesterol levels should be treated with medicines if diet and exercise are not effective.  If you smoke, find out from your caregiver how to quit. If you do not use tobacco, do not start.  If you are pregnant, do not drink alcohol. If you are breastfeeding, be very cautious about drinking alcohol. If you are not pregnant and choose to drink alcohol, do not exceed 1 drink per day. One drink is considered to be 12 ounces (355 mL) of beer, 5 ounces (148 mL) of wine, or 1.5 ounces (44 mL) of liquor.  Avoid use of street drugs. Do not share needles with anyone. Ask for help if you need support or instructions about stopping the use of drugs.  High blood pressure causes heart disease and increases the risk of stroke. Your blood pressure should be checked at least every 1 to 2 years. Ongoing high blood pressure should be treated with medicines if weight loss and exercise are not effective.  If you are 55 to 51 years old, ask your caregiver if you should take aspirin to prevent strokes.  Diabetes   screening involves taking a blood sample to check your fasting blood sugar level. This should be done once every 3 years, after age 45, if you are within normal weight and without risk factors for diabetes. Testing should be considered at a younger age or be carried out more frequently if you are overweight and have at least 1 risk factor for diabetes.  Breast cancer screening is essential preventive care for women. You should practice "breast  self-awareness." This means understanding the normal appearance and feel of your breasts and may include breast self-examination. Any changes detected, no matter how small, should be reported to a caregiver. Women in their 20s and 30s should have a clinical breast exam (CBE) by a caregiver as part of a regular health exam every 1 to 3 years. After age 40, women should have a CBE every year. Starting at age 40, women should consider having a mammography (breast X-ray test) every year. Women who have a family history of breast cancer should talk to their caregiver about genetic screening. Women at a high risk of breast cancer should talk to their caregivers about having magnetic resonance imaging (MRI) and a mammography every year.  The Pap test is a screening test for cervical cancer. A Pap test can show cell changes on the cervix that might become cervical cancer if left untreated. A Pap test is a procedure in which cells are obtained and examined from the lower end of the uterus (cervix).  Women should have a Pap test starting at age 21.  Between ages 21 and 29, Pap tests should be repeated every 2 years.  Beginning at age 30, you should have a Pap test every 3 years as long as the past 3 Pap tests have been normal.  Some women have medical problems that increase the chance of getting cervical cancer. Talk to your caregiver about these problems. It is especially important to talk to your caregiver if a new problem develops soon after your last Pap test. In these cases, your caregiver may recommend more frequent screening and Pap tests.  The above recommendations are the same for women who have or have not gotten the vaccine for human papillomavirus (HPV).  If you had a hysterectomy for a problem that was not cancer or a condition that could lead to cancer, then you no longer need Pap tests. Even if you no longer need a Pap test, a regular exam is a good idea to make sure no other problems are  starting.  If you are between ages 65 and 70, and you have had normal Pap tests going back 10 years, you no longer need Pap tests. Even if you no longer need a Pap test, a regular exam is a good idea to make sure no other problems are starting.  If you have had past treatment for cervical cancer or a condition that could lead to cancer, you need Pap tests and screening for cancer for at least 20 years after your treatment.  If Pap tests have been discontinued, risk factors (such as a new sexual partner) need to be reassessed to determine if screening should be resumed.  The HPV test is an additional test that may be used for cervical cancer screening. The HPV test looks for the virus that can cause the cell changes on the cervix. The cells collected during the Pap test can be tested for HPV. The HPV test could be used to screen women aged 30 years and older, and should   be used in women of any age who have unclear Pap test results. After the age of 30, women should have HPV testing at the same frequency as a Pap test.  Colorectal cancer can be detected and often prevented. Most routine colorectal cancer screening begins at the age of 50 and continues through age 75. However, your caregiver may recommend screening at an earlier age if you have risk factors for colon cancer. On a yearly basis, your caregiver may provide home test kits to check for hidden blood in the stool. Use of a small camera at the end of a tube, to directly examine the colon (sigmoidoscopy or colonoscopy), can detect the earliest forms of colorectal cancer. Talk to your caregiver about this at age 50, when routine screening begins. Direct examination of the colon should be repeated every 5 to 10 years through age 75, unless early forms of pre-cancerous polyps or small growths are found.  Hepatitis C blood testing is recommended for all people born from 1945 through 1965 and any individual with known risks for hepatitis C.  Practice  safe sex. Use condoms and avoid high-risk sexual practices to reduce the spread of sexually transmitted infections (STIs). STIs include gonorrhea, chlamydia, syphilis, trichomonas, herpes, HPV, and human immunodeficiency virus (HIV). Herpes, HIV, and HPV are viral illnesses that have no cure. They can result in disability, cancer, and death. Sexually active women aged 25 and younger should be checked for chlamydia. Older women with new or multiple partners should also be tested for chlamydia. Testing for other STIs is recommended if you are sexually active and at increased risk.  Osteoporosis is a disease in which the bones lose minerals and strength with aging. This can result in serious bone fractures. The risk of osteoporosis can be identified using a bone density scan. Women ages 65 and over and women at risk for fractures or osteoporosis should discuss screening with their caregivers. Ask your caregiver whether you should take a calcium supplement or vitamin D to reduce the rate of osteoporosis.  Menopause can be associated with physical symptoms and risks. Hormone replacement therapy is available to decrease symptoms and risks. You should talk to your caregiver about whether hormone replacement therapy is right for you.  Use sunscreen with sun protection factor (SPF) of 30 or more. Apply sunscreen liberally and repeatedly throughout the day. You should seek shade when your shadow is shorter than you. Protect yourself by wearing long sleeves, pants, a wide-brimmed hat, and sunglasses year round, whenever you are outdoors.  Once a month, do a whole body skin exam, using a mirror to look at the skin on your back. Notify your caregiver of new moles, moles that have irregular borders, moles that are larger than a pencil eraser, or moles that have changed in shape or color.  Stay current with required immunizations.  Influenza. You need a dose every fall (or winter). The composition of the flu vaccine  changes each year, so being vaccinated once is not enough.  Pneumococcal polysaccharide. You need 1 to 2 doses if you smoke cigarettes or if you have certain chronic medical conditions. You need 1 dose at age 65 (or older) if you have never been vaccinated.  Tetanus, diphtheria, pertussis (Tdap, Td). Get 1 dose of Tdap vaccine if you are younger than age 65, are over 65 and have contact with an infant, are a healthcare worker, are pregnant, or simply want to be protected from whooping cough. After that, you need a Td   booster dose every 10 years. Consult your caregiver if you have not had at least 3 tetanus and diphtheria-containing shots sometime in your life or have a deep or dirty wound.  HPV. You need this vaccine if you are a woman age 26 or younger. The vaccine is given in 3 doses over 6 months.  Measles, mumps, rubella (MMR). You need at least 1 dose of MMR if you were born in 1957 or later. You may also need a second dose.  Meningococcal. If you are age 19 to 21 and a first-year college student living in a residence hall, or have one of several medical conditions, you need to get vaccinated against meningococcal disease. You may also need additional booster doses.  Zoster (shingles). If you are age 60 or older, you should get this vaccine.  Varicella (chickenpox). If you have never had chickenpox or you were vaccinated but received only 1 dose, talk to your caregiver to find out if you need this vaccine.  Hepatitis A. You need this vaccine if you have a specific risk factor for hepatitis A virus infection or you simply wish to be protected from this disease. The vaccine is usually given as 2 doses, 6 to 18 months apart.  Hepatitis B. You need this vaccine if you have a specific risk factor for hepatitis B virus infection or you simply wish to be protected from this disease. The vaccine is given in 3 doses, usually over 6 months. Preventive Services / Frequency Ages 19 to 39  Blood  pressure check.** / Every 1 to 2 years.  Lipid and cholesterol check.** / Every 5 years beginning at age 20.  Clinical breast exam.** / Every 3 years for women in their 20s and 30s.  Pap test.** / Every 2 years from ages 21 through 29. Every 3 years starting at age 30 through age 65 or 70 with a history of 3 consecutive normal Pap tests.  HPV screening.** / Every 3 years from ages 30 through ages 65 to 70 with a history of 3 consecutive normal Pap tests.  Hepatitis C blood test.** / For any individual with known risks for hepatitis C.  Skin self-exam. / Monthly.  Influenza immunization.** / Every year.  Pneumococcal polysaccharide immunization.** / 1 to 2 doses if you smoke cigarettes or if you have certain chronic medical conditions.  Tetanus, diphtheria, pertussis (Tdap, Td) immunization. / A one-time dose of Tdap vaccine. After that, you need a Td booster dose every 10 years.  HPV immunization. / 3 doses over 6 months, if you are 26 and younger.  Measles, mumps, rubella (MMR) immunization. / You need at least 1 dose of MMR if you were born in 1957 or later. You may also need a second dose.  Meningococcal immunization. / 1 dose if you are age 19 to 21 and a first-year college student living in a residence hall, or have one of several medical conditions, you need to get vaccinated against meningococcal disease. You may also need additional booster doses.  Varicella immunization.** / Consult your caregiver.  Hepatitis A immunization.** / Consult your caregiver. 2 doses, 6 to 18 months apart.  Hepatitis B immunization.** / Consult your caregiver. 3 doses usually over 6 months. Ages 40 to 64  Blood pressure check.** / Every 1 to 2 years.  Lipid and cholesterol check.** / Every 5 years beginning at age 20.  Clinical breast exam.** / Every year after age 40.  Mammogram.** / Every year beginning at age 40   and continuing for as long as you are in good health. Consult with your  caregiver.  Pap test.** / Every 3 years starting at age 30 through age 65 or 70 with a history of 3 consecutive normal Pap tests.  HPV screening.** / Every 3 years from ages 30 through ages 65 to 70 with a history of 3 consecutive normal Pap tests.  Fecal occult blood test (FOBT) of stool. / Every year beginning at age 50 and continuing until age 75. You may not need to do this test if you get a colonoscopy every 10 years.  Flexible sigmoidoscopy or colonoscopy.** / Every 5 years for a flexible sigmoidoscopy or every 10 years for a colonoscopy beginning at age 50 and continuing until age 75.  Hepatitis C blood test.** / For all people born from 1945 through 1965 and any individual with known risks for hepatitis C.  Skin self-exam. / Monthly.  Influenza immunization.** / Every year.  Pneumococcal polysaccharide immunization.** / 1 to 2 doses if you smoke cigarettes or if you have certain chronic medical conditions.  Tetanus, diphtheria, pertussis (Tdap, Td) immunization.** / A one-time dose of Tdap vaccine. After that, you need a Td booster dose every 10 years.  Measles, mumps, rubella (MMR) immunization. / You need at least 1 dose of MMR if you were born in 1957 or later. You may also need a second dose.  Varicella immunization.** / Consult your caregiver.  Meningococcal immunization.** / Consult your caregiver.  Hepatitis A immunization.** / Consult your caregiver. 2 doses, 6 to 18 months apart.  Hepatitis B immunization.** / Consult your caregiver. 3 doses, usually over 6 months. Ages 65 and over  Blood pressure check.** / Every 1 to 2 years.  Lipid and cholesterol check.** / Every 5 years beginning at age 20.  Clinical breast exam.** / Every year after age 40.  Mammogram.** / Every year beginning at age 40 and continuing for as long as you are in good health. Consult with your caregiver.  Pap test.** / Every 3 years starting at age 30 through age 65 or 70 with a 3  consecutive normal Pap tests. Testing can be stopped between 65 and 70 with 3 consecutive normal Pap tests and no abnormal Pap or HPV tests in the past 10 years.  HPV screening.** / Every 3 years from ages 30 through ages 65 or 70 with a history of 3 consecutive normal Pap tests. Testing can be stopped between 65 and 70 with 3 consecutive normal Pap tests and no abnormal Pap or HPV tests in the past 10 years.  Fecal occult blood test (FOBT) of stool. / Every year beginning at age 50 and continuing until age 75. You may not need to do this test if you get a colonoscopy every 10 years.  Flexible sigmoidoscopy or colonoscopy.** / Every 5 years for a flexible sigmoidoscopy or every 10 years for a colonoscopy beginning at age 50 and continuing until age 75.  Hepatitis C blood test.** / For all people born from 1945 through 1965 and any individual with known risks for hepatitis C.  Osteoporosis screening.** / A one-time screening for women ages 65 and over and women at risk for fractures or osteoporosis.  Skin self-exam. / Monthly.  Influenza immunization.** / Every year.  Pneumococcal polysaccharide immunization.** / 1 dose at age 65 (or older) if you have never been vaccinated.  Tetanus, diphtheria, pertussis (Tdap, Td) immunization. / A one-time dose of Tdap vaccine if you are over   65 and have contact with an infant, are a healthcare worker, or simply want to be protected from whooping cough. After that, you need a Td booster dose every 10 years.  Varicella immunization.** / Consult your caregiver.  Meningococcal immunization.** / Consult your caregiver.  Hepatitis A immunization.** / Consult your caregiver. 2 doses, 6 to 18 months apart.  Hepatitis B immunization.** / Check with your caregiver. 3 doses, usually over 6 months. ** Family history and personal history of risk and conditions may change your caregiver's recommendations. Document Released: 07/22/2001 Document Revised: 08/18/2011  Document Reviewed: 10/21/2010 [employer] Patient Information 2013 [employer].  

## 2012-09-17 NOTE — Telephone Encounter (Signed)
Please advise on appt request for colonoscopy.   Appointment Request From: Emily Mejia With Provider: Loreen Freud, DO [-Primary Care Physician-]  Preferred Date Range: Any date 09/20/2012 or later  Preferred Times: Monday Morning  Reason: To address the following health maintenance concerns. Colonoscopy

## 2012-09-20 ENCOUNTER — Encounter: Payer: Self-pay | Admitting: Gastroenterology

## 2012-09-20 LAB — POCT URINALYSIS DIPSTICK
Bilirubin, UA: NEGATIVE
Glucose, UA: NEGATIVE
Ketones, UA: NEGATIVE
Leukocytes, UA: NEGATIVE
Nitrite, UA: NEGATIVE
Protein, UA: NEGATIVE
Spec Grav, UA: >=1.03
Urobilinogen, UA: 0.2
pH, UA: 6

## 2012-10-26 ENCOUNTER — Ambulatory Visit (AMBULATORY_SURGERY_CENTER): Payer: BC Managed Care – PPO

## 2012-10-26 VITALS — Ht 68.0 in | Wt 191.4 lb

## 2012-10-26 DIAGNOSIS — Z1211 Encounter for screening for malignant neoplasm of colon: Secondary | ICD-10-CM

## 2012-10-26 MED ORDER — MOVIPREP 100 G PO SOLR: ORAL | Status: DC

## 2012-10-27 ENCOUNTER — Encounter: Payer: Self-pay | Admitting: Gastroenterology

## 2012-11-08 ENCOUNTER — Encounter: Payer: BC Managed Care – PPO | Admitting: Gastroenterology

## 2012-11-15 ENCOUNTER — Encounter: Payer: BC Managed Care – PPO | Admitting: Gastroenterology

## 2012-11-29 ENCOUNTER — Ambulatory Visit (AMBULATORY_SURGERY_CENTER): Payer: BC Managed Care – PPO | Admitting: Gastroenterology

## 2012-11-29 ENCOUNTER — Encounter: Payer: Self-pay | Admitting: Gastroenterology

## 2012-11-29 VITALS — BP 111/71 | HR 63 | Temp 98.6°F | Resp 15 | Ht 68.0 in | Wt 191.0 lb

## 2012-11-29 DIAGNOSIS — Z1211 Encounter for screening for malignant neoplasm of colon: Secondary | ICD-10-CM

## 2012-11-29 DIAGNOSIS — Z538 Procedure and treatment not carried out for other reasons: Secondary | ICD-10-CM

## 2012-11-29 MED ORDER — SODIUM CHLORIDE 0.9 % IV SOLN: 500.0000 mL | INTRAVENOUS | Status: DC

## 2012-11-29 NOTE — Progress Notes (Signed)
Procedure ends, to recovery, report given and VSS. 

## 2012-11-29 NOTE — Patient Instructions (Addendum)

## 2012-11-29 NOTE — Op Note (Signed)
[facility] [address] [place], [address]   COLONOSCOPY PROCEDURE REPORT  PATIENT: Emily Mejia, Emily Mejia  MR#: [id number] BIRTHDATE: 11-08-1960 , 51  yrs. old GENDER: Female ENDOSCOPIST: Mardella Layman, MD, [facility] REFERRED BY:  Loreen Freud, DO PROCEDURE DATE:  11/29/2012 PROCEDURE:   Colonoscopy, surveillance ASA CLASS:   Class II INDICATIONS:Colorectal cancer screening and elevated risk screening.  MEDICATIONS: Propofol (Diprivan) 230 mg IV  DESCRIPTION OF PROCEDURE:   After the risks and benefits and of the procedure were explained, informed consent was obtained.  A digital rectal exam revealed no abnormalities of the rectum.    The [id number]  endoscope was introduced through the anus and advanced to the cecum, which was identified by both the appendix and ileocecal valve .  The quality of the prep was poor, using MoviPrep .  The instrument was then slowly withdrawn as the colon was fully examined.     COLON FINDINGS: A normal appearing cecum, ileocecal valve, and appendiceal orifice were identified.  The ascending, hepatic flexure, transverse, splenic flexure, descending, sigmoid colon and rectum appeared unremarkable.  No polyps or cancers were seen.but very limited visualization of the entire colon because of a very poor prep     Retroflexed views revealed no abnormalities.     The scope was then withdrawn from the patient and the procedure completed.  COMPLICATIONS: There were no complications. ENDOSCOPIC IMPRESSION: Normal colon...very limited exam because of extremely poor colon prep  RECOMMENDATIONS: 1.  followup colonoscopy in one year with a" double prep". REPEAT EXAM:  cc:  _______________________________ eSignedMardella Layman, MD, [facility] 11/29/2012 1:57 PM

## 2012-11-29 NOTE — Progress Notes (Signed)
Patient did not have preoperative order for IV antibiotic SSI prophylaxis. (G8918)  Patient did not experience any of the following events: a burn prior to discharge; a fall within the facility; wrong site/side/patient/procedure/implant event; or a hospital transfer or hospital admission upon discharge from the facility. ([id number])  

## 2012-11-30 ENCOUNTER — Telehealth: Payer: Self-pay

## 2012-11-30 NOTE — Telephone Encounter (Signed)
  Follow up Call-  Call back number 11/29/2012  Post procedure Call Back phone  # [phone]  Permission to leave phone message Yes     Patient questions:  Do you have a fever, pain , or abdominal swelling? no Pain Score  0 *  Have you tolerated food without any problems? yes  Have you been able to return to your normal activities? yes  Do you have any questions about your discharge instructions: Diet   no Medications  no Follow up visit  no  Do you have questions or concerns about your Care? no  Actions: * If pain score is 4 or above: No action needed, pain <4.

## 2013-04-14 ENCOUNTER — Other Ambulatory Visit: Payer: Self-pay

## 2013-06-29 ENCOUNTER — Other Ambulatory Visit: Payer: Self-pay | Admitting: Family Medicine

## 2013-12-09 ENCOUNTER — Ambulatory Visit (INDEPENDENT_AMBULATORY_CARE_PROVIDER_SITE_OTHER): Payer: BC Managed Care – PPO | Admitting: Family Medicine

## 2013-12-09 ENCOUNTER — Encounter: Payer: Self-pay | Admitting: Family Medicine

## 2013-12-09 VITALS — BP 124/82 | HR 77 | Temp 98.1°F | Wt 202.2 lb

## 2013-12-09 DIAGNOSIS — M545 Low back pain, unspecified: Secondary | ICD-10-CM

## 2013-12-09 NOTE — Progress Notes (Signed)
Subjective:    Patient ID: Emily Mejia, female    DOB: 01-10-1961, 53 y.o.   MRN: [id number]  HPI Here for pulled muscle in back   Has been going to a Sat exercise class  Came home last Sat with low back pain worse on L than R Went to [employer] and that made it worse intitially and then much better  Was told she has a muscle spasm  Now she has a small area of tingling/numbness on L side of low back which concerns her No radiation of pain or numbness to leg or foot No weakness of any kind No rash   She has used ice and heat and some stretching which was helpful   Aleve or advil pm does help symptoms   Patient Active Problem List   Diagnosis Date Noted  . HYPOGLYCEMIA 07/19/2010  . URINARY TRACT INFECTION, RECURRENT 06/12/2009  . OVARIAN CYST 06/12/2009  . IRRITABLE BOWEL SYNDROME, HX OF 06/12/2009   Past Medical History  Diagnosis Date  . Ovarian cyst   . IBS (irritable bowel syndrome)   . Toenail fungus    Past Surgical History  Procedure Laterality Date  . Ovarian cyst surgery     History  Substance Use Topics  . Smoking status: Never Smoker   . Smokeless tobacco: Never Used  . Alcohol Use: No   Family History  Problem Relation Age of Onset  . Gallbladder disease Mother     Gallbladder Cancer  . Irritable bowel syndrome Mother   . Diabetes Mother   . Cancer Mother     GB, liver mets  . Hyperlipidemia Mother   . Hypertension Mother   . Coronary artery disease    . Diabetes Father   . Hypertension Father   . Hyperlipidemia Father   . Cancer Brother 75    lymphoma   No Known Allergies Current Outpatient Prescriptions on File Prior to Visit  Medication Sig Dispense Refill  . aspirin 81 MG tablet Take 81 mg by mouth daily.      . calcium carbonate (OS-CAL) 600 MG TABS Take 600 mg by mouth daily.      . cholecalciferol (VITAMIN D) 1000 UNITS tablet Take 1,000 Units by mouth daily.      . ciclopirox (PENLAC) 8 % solution Apply topically at bedtime.  Apply over nail and surrounding skin. Apply daily over previous coat. After seven (7) days, may remove with alcohol and continue cycle.  6.6 mL  1  . co-enzyme Q-10 30 MG capsule Take 30 mg by mouth daily.       . Cranberry 140-100-3 MG-MG-UNIT CAPS Take 1 capsule by mouth daily.      . Folic Acid 045 MCG TABS Take 1 tablet by mouth daily.      . nitrofurantoin, macrocrystal-monohydrate, (MACROBID) 100 MG capsule Take 1 capsule (100 mg total) by mouth as needed.  30 capsule  5   No current facility-administered medications on file prior to visit.     Review of Systems Review of Systems  Constitutional: Negative for fever, appetite change, fatigue and unexpected weight change.  Eyes: Negative for pain and visual disturbance.  Respiratory: Negative for cough and shortness of breath.   Cardiovascular: Negative for cp or palpitations    Gastrointestinal: Negative for nausea, diarrhea and constipation.  Genitourinary: Negative for urgency and frequency.  Skin: Negative for pallor or rash   MSK pos for L sided low back pain  Neurological: Negative for weakness, light-headedness,  and headaches. pos for numbness/ tingling over L low back  Hematological: Negative for adenopathy. Does not bruise/bleed easily.  Psychiatric/Behavioral: Negative for dysphoric mood. The patient is not nervous/anxious.         Objective:   Physical Exam  Constitutional: She appears well-developed and well-nourished. No distress.  HENT:  Head: Normocephalic and atraumatic.  Eyes: Conjunctivae and EOM are normal. Pupils are equal, round, and reactive to light.  Neck: Normal range of motion. Neck supple.  Cardiovascular: Normal rate and regular rhythm.   Pulmonary/Chest: Effort normal and breath sounds normal.  Musculoskeletal: She exhibits tenderness. She exhibits no edema.  Tender in L lumbar musculature with some palpable spasm No bony tenderness Nl rom LS - more pain with extension Nl gait     Lymphadenopathy:    She has no cervical adenopathy.  Neurological: She is alert. She has normal reflexes. She displays no atrophy. No sensory deficit. She exhibits normal muscle tone. Coordination and gait normal.  No foot drop with gait   Skin: Skin is warm and dry. No rash noted. No erythema. No pallor.  Pt has some acne on back  Psychiatric: She has a normal mood and affect.          Assessment & Plan:    Problem List Items Addressed This Visit     Other   Low back pain - Primary     Muscular and improved after chiropractic adjustment  Pt has sens of numbness over L low back -no rad to leg and no focal neuro findings on exam   Pain relief with aleve-recommended 2 pills with food up to bid prn - watch for GI symptoms and do not mix with other nsaids  Will continue heat/ice/walking/ stretching  Update if not starting to improve in a week or if worsening

## 2013-12-09 NOTE — Progress Notes (Signed)
Pre visit review using our clinic review tool, if applicable. No additional management support is needed unless otherwise documented below in the visit note. 

## 2013-12-11 DIAGNOSIS — M545 Low back pain, unspecified: Secondary | ICD-10-CM | POA: Insufficient documentation

## 2013-12-11 NOTE — Assessment & Plan Note (Signed)
Muscular and improved after chiropractic adjustment  Pt has sens of numbness over L low back -no rad to leg and no focal neuro findings on exam   Pain relief with aleve-recommended 2 pills with food up to bid prn - watch for GI symptoms and do not mix with other nsaids  Will continue heat/ice/walking/ stretching  Update if not starting to improve in a week or if worsening

## 2013-12-19 ENCOUNTER — Encounter: Payer: Self-pay | Admitting: Internal Medicine

## 2014-04-27 ENCOUNTER — Other Ambulatory Visit: Payer: Self-pay | Admitting: Family Medicine

## 2014-04-27 DIAGNOSIS — Z1231 Encounter for screening mammogram for malignant neoplasm of breast: Secondary | ICD-10-CM

## 2014-05-02 ENCOUNTER — Ambulatory Visit (HOSPITAL_BASED_OUTPATIENT_CLINIC_OR_DEPARTMENT_OTHER)
Admission: RE | Admit: 2014-05-02 | Discharge: 2014-05-02 | Disposition: A | Discharge status: Home / Self Care | Payer: 59 | Source: Ambulatory Visit | Attending: Family Medicine | Admitting: Family Medicine

## 2014-05-02 DIAGNOSIS — Z1231 Encounter for screening mammogram for malignant neoplasm of breast: Secondary | ICD-10-CM | POA: Diagnosis not present

## 2014-07-06 ENCOUNTER — Encounter: Payer: Self-pay | Admitting: Family Medicine

## 2014-07-06 ENCOUNTER — Ambulatory Visit (INDEPENDENT_AMBULATORY_CARE_PROVIDER_SITE_OTHER): Payer: 59 | Admitting: Family Medicine

## 2014-07-06 VITALS — BP 124/77 | HR 70 | Temp 98.0°F | Ht 68.0 in | Wt 193.8 lb

## 2014-07-06 DIAGNOSIS — Z0000 Encounter for general adult medical examination without abnormal findings: Secondary | ICD-10-CM

## 2014-07-06 DIAGNOSIS — N390 Urinary tract infection, site not specified: Secondary | ICD-10-CM

## 2014-07-06 LAB — HEPATIC FUNCTION PANEL
ALT: 11 U/L (ref 0–35)
AST: 17 U/L (ref 0–37)
Albumin: 4.4 g/dL (ref 3.5–5.2)
Alkaline Phosphatase: 52 U/L (ref 39–117)
Bilirubin, Direct: 0.1 mg/dL (ref 0.0–0.3)
Total Bilirubin: 0.6 mg/dL (ref 0.2–1.2)
Total Protein: 7.4 g/dL (ref 6.0–8.3)

## 2014-07-06 LAB — LIPID PANEL
Cholesterol: 146 mg/dL (ref 0–200)
HDL: 52 mg/dL (ref >39.00)
LDL Cholesterol: 80 mg/dL (ref 0–99)
NonHDL: 94
Total CHOL/HDL Ratio: 3
Triglycerides: 72 mg/dL (ref 0.0–149.0)
VLDL: 14.4 mg/dL (ref 0.0–40.0)

## 2014-07-06 LAB — TSH: TSH: 1.45 u[IU]/mL (ref 0.35–4.50)

## 2014-07-06 LAB — BASIC METABOLIC PANEL
BUN: 15 mg/dL (ref 6–23)
CO2: 26 mEq/L (ref 19–32)
Calcium: 9.3 mg/dL (ref 8.4–10.5)
Chloride: 104 mEq/L (ref 96–112)
Creatinine, Ser: 0.9 mg/dL (ref 0.40–1.20)
GFR: 69.8 mL/min (ref >60.00)
Glucose, Bld: 85 mg/dL (ref 70–99)
Potassium: 4.2 mEq/L (ref 3.5–5.1)
Sodium: 139 mEq/L (ref 135–145)

## 2014-07-06 LAB — CBC WITH DIFFERENTIAL/PLATELET
Basophils Absolute: 0.1 10*3/uL (ref 0.0–0.1)
Basophils Relative: 0.8 % (ref 0.0–3.0)
Eosinophils Absolute: 0.1 10*3/uL (ref 0.0–0.7)
Eosinophils Relative: 1.4 % (ref 0.0–5.0)
HCT: 43 % (ref 36.0–46.0)
Hemoglobin: 14.8 g/dL (ref 12.0–15.0)
Lymphocytes Relative: 32 % (ref 12.0–46.0)
Lymphs Abs: 2.2 10*3/uL (ref 0.7–4.0)
MCHC: 34.4 g/dL (ref 30.0–36.0)
MCV: 82 fl (ref 78.0–100.0)
Monocytes Absolute: 0.6 10*3/uL (ref 0.1–1.0)
Monocytes Relative: 8.2 % (ref 3.0–12.0)
Neutro Abs: 3.9 10*3/uL (ref 1.4–7.7)
Neutrophils Relative %: 57.6 % (ref 43.0–77.0)
Platelets: 283 10*3/uL (ref 150.0–400.0)
RBC: 5.24 Mil/uL — ABNORMAL HIGH (ref 3.87–5.11)
RDW: 13.8 % (ref 11.5–15.5)
WBC: 6.8 10*3/uL (ref 4.0–10.5)

## 2014-07-06 MED ORDER — NITROFURANTOIN MONOHYD MACRO 100 MG PO CAPS: 100.0000 mg | ORAL_CAPSULE | ORAL | Status: DC | PRN

## 2014-07-06 NOTE — Progress Notes (Signed)
Pre visit review using our clinic review tool, if applicable. No additional management support is needed unless otherwise documented below in the visit note. 

## 2014-07-06 NOTE — Progress Notes (Signed)
Subjective:     Emily Mejia is a 54 y.o. female and is here for a comprehensive physical exam. The patient reports no problems.  History   Social History  . Marital Status: Divorced    Spouse Name: N/A    Number of Children: N/A  . Years of Education: N/A   Occupational History  . [employer] and [employer] History Main Topics  . Smoking status: Never Smoker   . Smokeless tobacco: Never Used  . Alcohol Use: No  . Drug Use: No  . Sexual Activity:    Partners: Male   Other Topics Concern  . Not on file   Social History Narrative   Exercise--- walks --  [employer] [place]   Health Maintenance  Topic Date Due  . COLONOSCOPY  11/29/2013  . INFLUENZA VACCINE  01/08/2015  . MAMMOGRAM  05/03/2015  . PAP SMEAR  09/18/2015  . TETANUS/TDAP  09/14/2021    The following portions of the patient's history were reviewed and updated as appropriate:  She  has a past medical history of Ovarian cyst; IBS (irritable bowel syndrome); and Toenail fungus. She  does not have any pertinent problems on file. She  has past surgical history that includes Ovarian cyst surgery. Her family history includes Cancer in her mother; Cancer (age of onset: 77) in her brother; Coronary artery disease in an other family member; Diabetes in her father and mother; Gallbladder disease in her mother; Hyperlipidemia in her father and mother; Hypertension in her father and mother; Irritable bowel syndrome in her mother. She  reports that she has never smoked. She has never used smokeless tobacco. She reports that she does not drink alcohol or use illicit drugs. She has a current medication list which includes the following prescription(s): aspirin, calcium carbonate, cholecalciferol, clobetasol prop emollient base, co-enzyme q-10, cranberry, dermatological products, misc., folic acid, nitrofurantoin (macrocrystal-monohydrate), potassium, and vitamin b-12. Current Outpatient  Prescriptions on File Prior to Visit  Medication Sig Dispense Refill  . aspirin 81 MG tablet Take 81 mg by mouth daily.    . calcium carbonate (OS-CAL) 600 MG TABS Take 600 mg by mouth daily.    . cholecalciferol (VITAMIN D) 1000 UNITS tablet Take 1,000 Units by mouth daily.    Marland Kitchen co-enzyme Q-10 30 MG capsule Take 30 mg by mouth daily.     . Cranberry 140-100-3 MG-MG-UNIT CAPS Take 1 capsule by mouth daily.    . Folic Acid 818 MCG TABS Take 1 tablet by mouth daily.    . Potassium (POTASSIMIN PO) Take 1 tablet by mouth daily.    . vitamin B-12 (CYANOCOBALAMIN) 100 MCG tablet Take 100 mcg by mouth daily.     No current facility-administered medications on file prior to visit.   She has No Known Allergies..  Review of Systems Review of Systems  Constitutional: Negative for activity change, appetite change and fatigue.  HENT: Negative for hearing loss, congestion, tinnitus and ear discharge.  dentist q67m Eyes: Negative for visual disturbance (see optho q1y -- vision corrected to 20/20 with glasses).  Respiratory: Negative for cough, chest tightness and shortness of breath.   Cardiovascular: Negative for chest pain, palpitations and leg swelling.  Gastrointestinal: Negative for abdominal pain, diarrhea, constipation and abdominal distention.  Genitourinary: Negative for urgency, frequency, decreased urine volume and difficulty urinating.  Musculoskeletal: Negative for back pain, arthralgias and gait problem.  Skin: Negative for color change, pallor and rash.  Neurological:  Negative for dizziness, light-headedness, numbness and headaches.  Hematological: Negative for adenopathy. Does not bruise/bleed easily.  Psychiatric/Behavioral: Negative for suicidal ideas, confusion, sleep disturbance, self-injury, dysphoric mood, decreased concentration and agitation.       Objective:    BP 124/77 mmHg  Pulse 70  Temp(Src) 98 F (36.7 C) (Oral)  Ht 5\' 8"  (1.727 m)  Wt 193 lb 12.8 oz (87.907  kg)  BMI 29.47 kg/m2  SpO2 100% General appearance: alert, cooperative, appears stated age and no distress Head: Normocephalic, without obvious abnormality, atraumatic Eyes: conjunctivae/corneas clear. PERRL, EOM's intact. Fundi benign. Ears: normal TM's and external ear canals both ears Nose: Nares normal. Septum midline. Mucosa normal. No drainage or sinus tenderness. Throat: lips, mucosa, and tongue normal; teeth and gums normal Neck: no adenopathy, no carotid bruit, no JVD, supple, symmetrical, trachea midline and thyroid not enlarged, symmetric, no tenderness/mass/nodules Back: symmetric, no curvature. ROM normal. No CVA tenderness. Lungs: clear to auscultation bilaterally Breasts: normal appearance, no masses or tenderness Heart: regular rate and rhythm, S1, S2 normal, no murmur, click, rub or gallop Abdomen: soft, non-tender; bowel sounds normal; no masses,  no organomegaly Pelvic: deferred Extremities: extremities normal, atraumatic, no cyanosis or edema Pulses: 2+ and symmetric Skin: Skin color, texture, turgor normal. No rashes or lesions Lymph nodes: Cervical, supraclavicular, and axillary nodes normal. Neurologic: Alert and oriented X 3, normal strength and tone. Normal symmetric reflexes. Normal coordination and gait Psych- no depression, no anxiety      Assessment:    Healthy female exam.      Plan:  Check labs  ghm utd See After Visit Summary for Counseling Recommendations   1. Recurrent UTI  - nitrofurantoin, macrocrystal-monohydrate, (MACROBID) 100 MG capsule; Take 1 capsule (100 mg total) by mouth as needed.  Dispense: 30 capsule; Refill: 5  2. Preventative health care  - Basic metabolic panel - CBC with Differential/Platelet - Hepatic function panel - Lipid panel - POCT urinalysis dipstick - TSH

## 2014-07-06 NOTE — Patient Instructions (Signed)
Preventive Care for Adults A healthy lifestyle and preventive care can promote health and wellness. Preventive health guidelines for women include the following key practices.  A routine yearly physical is a good way to check with your health care provider about your health and preventive screening. It is a chance to share any concerns and updates on your health and to receive a thorough exam.  Visit your dentist for a routine exam and preventive care every 6 months. Brush your teeth twice a day and floss once a day. Good oral hygiene prevents tooth decay and gum disease.  The frequency of eye exams is based on your age, health, family medical history, use of contact lenses, and other factors. Follow your health care provider's recommendations for frequency of eye exams.  Eat a healthy diet. Foods like vegetables, fruits, whole grains, low-fat dairy products, and lean protein foods contain the nutrients you need without too many calories. Decrease your intake of foods high in solid fats, added sugars, and salt. Eat the right amount of calories for you.Get information about a proper diet from your health care provider, if necessary.  Regular physical exercise is one of the most important things you can do for your health. Most adults should get at least 150 minutes of moderate-intensity exercise (any activity that increases your heart rate and causes you to sweat) each week. In addition, most adults need muscle-strengthening exercises on 2 or more days a week.  Maintain a healthy weight. The body mass index (BMI) is a screening tool to identify possible weight problems. It provides an estimate of body fat based on height and weight. Your health care provider can find your BMI and can help you achieve or maintain a healthy weight.For adults 20 years and older:  A BMI below 18.5 is considered underweight.  A BMI of 18.5 to 24.9 is normal.  A BMI of 25 to 29.9 is considered overweight.  A BMI of  30 and above is considered obese.  Maintain normal blood lipids and cholesterol levels by exercising and minimizing your intake of saturated fat. Eat a balanced diet with plenty of fruit and vegetables. Blood tests for lipids and cholesterol should begin at age 76 and be repeated every 5 years. If your lipid or cholesterol levels are high, you are over 50, or you are at high risk for heart disease, you may need your cholesterol levels checked more frequently.Ongoing high lipid and cholesterol levels should be treated with medicines if diet and exercise are not working.  If you smoke, find out from your health care provider how to quit. If you do not use tobacco, do not start.  Lung cancer screening is recommended for adults aged 22-80 years who are at high risk for developing lung cancer because of a history of smoking. A yearly low-dose CT scan of the lungs is recommended for people who have at least a 30-pack-year history of smoking and are a current smoker or have quit within the past 15 years. A pack year of smoking is smoking an average of 1 pack of cigarettes a day for 1 year (for example: 1 pack a day for 30 years or 2 packs a day for 15 years). Yearly screening should continue until the smoker has stopped smoking for at least 15 years. Yearly screening should be stopped for people who develop a health problem that would prevent them from having lung cancer treatment.  If you are pregnant, do not drink alcohol. If you are breastfeeding,  be very cautious about drinking alcohol. If you are not pregnant and choose to drink alcohol, do not have more than 1 drink per day. One drink is considered to be 12 ounces (355 mL) of beer, 5 ounces (148 mL) of wine, or 1.5 ounces (44 mL) of liquor.  Avoid use of street drugs. Do not share needles with anyone. Ask for help if you need support or instructions about stopping the use of drugs.  High blood pressure causes heart disease and increases the risk of  stroke. Your blood pressure should be checked at least every 1 to 2 years. Ongoing high blood pressure should be treated with medicines if weight loss and exercise do not work.  If you are 3-86 years old, ask your health care provider if you should take aspirin to prevent strokes.  Diabetes screening involves taking a blood sample to check your fasting blood sugar level. This should be done once every 3 years, after age 67, if you are within normal weight and without risk factors for diabetes. Testing should be considered at a younger age or be carried out more frequently if you are overweight and have at least 1 risk factor for diabetes.  Breast cancer screening is essential preventive care for women. You should practice "breast self-awareness." This means understanding the normal appearance and feel of your breasts and may include breast self-examination. Any changes detected, no matter how small, should be reported to a health care provider. Women in their 8s and 30s should have a clinical breast exam (CBE) by a health care provider as part of a regular health exam every 1 to 3 years. After age 70, women should have a CBE every year. Starting at age 25, women should consider having a mammogram (breast X-ray test) every year. Women who have a family history of breast cancer should talk to their health care provider about genetic screening. Women at a high risk of breast cancer should talk to their health care providers about having an MRI and a mammogram every year.  Breast cancer gene (BRCA)-related cancer risk assessment is recommended for women who have family members with BRCA-related cancers. BRCA-related cancers include breast, ovarian, tubal, and peritoneal cancers. Having family members with these cancers may be associated with an increased risk for harmful changes (mutations) in the breast cancer genes BRCA1 and BRCA2. Results of the assessment will determine the need for genetic counseling and  BRCA1 and BRCA2 testing.  Routine pelvic exams to screen for cancer are no longer recommended for nonpregnant women who are considered low risk for cancer of the pelvic organs (ovaries, uterus, and vagina) and who do not have symptoms. Ask your health care provider if a screening pelvic exam is right for you.  If you have had past treatment for cervical cancer or a condition that could lead to cancer, you need Pap tests and screening for cancer for at least 20 years after your treatment. If Pap tests have been discontinued, your risk factors (such as having a new sexual partner) need to be reassessed to determine if screening should be resumed. Some women have medical problems that increase the chance of getting cervical cancer. In these cases, your health care provider may recommend more frequent screening and Pap tests.  The HPV test is an additional test that may be used for cervical cancer screening. The HPV test looks for the virus that can cause the cell changes on the cervix. The cells collected during the Pap test can be  tested for HPV. The HPV test could be used to screen women aged 30 years and older, and should be used in women of any age who have unclear Pap test results. After the age of 30, women should have HPV testing at the same frequency as a Pap test.  Colorectal cancer can be detected and often prevented. Most routine colorectal cancer screening begins at the age of 50 years and continues through age 75 years. However, your health care provider may recommend screening at an earlier age if you have risk factors for colon cancer. On a yearly basis, your health care provider may provide home test kits to check for hidden blood in the stool. Use of a small camera at the end of a tube, to directly examine the colon (sigmoidoscopy or colonoscopy), can detect the earliest forms of colorectal cancer. Talk to your health care provider about this at age 50, when routine screening begins. Direct  exam of the colon should be repeated every 5-10 years through age 75 years, unless early forms of pre-cancerous polyps or small growths are found.  People who are at an increased risk for hepatitis B should be screened for this virus. You are considered at high risk for hepatitis B if:  You were born in a country where hepatitis B occurs often. Talk with your health care provider about which countries are considered high risk.  Your parents were born in a high-risk country and you have not received a shot to protect against hepatitis B (hepatitis B vaccine).  You have HIV or AIDS.  You use needles to inject street drugs.  You live with, or have sex with, someone who has hepatitis B.  You get hemodialysis treatment.  You take certain medicines for conditions like cancer, organ transplantation, and autoimmune conditions.  Hepatitis C blood testing is recommended for all people born from 1945 through 1965 and any individual with known risks for hepatitis C.  Practice safe sex. Use condoms and avoid high-risk sexual practices to reduce the spread of sexually transmitted infections (STIs). STIs include gonorrhea, chlamydia, syphilis, trichomonas, herpes, HPV, and human immunodeficiency virus (HIV). Herpes, HIV, and HPV are viral illnesses that have no cure. They can result in disability, cancer, and death.  You should be screened for sexually transmitted illnesses (STIs) including gonorrhea and chlamydia if:  You are sexually active and are younger than 24 years.  You are older than 24 years and your health care provider tells you that you are at risk for this type of infection.  Your sexual activity has changed since you were last screened and you are at an increased risk for chlamydia or gonorrhea. Ask your health care provider if you are at risk.  If you are at risk of being infected with HIV, it is recommended that you take a prescription medicine daily to prevent HIV infection. This is  called preexposure prophylaxis (PrEP). You are considered at risk if:  You are a heterosexual woman, are sexually active, and are at increased risk for HIV infection.  You take drugs by injection.  You are sexually active with a partner who has HIV.  Talk with your health care provider about whether you are at high risk of being infected with HIV. If you choose to begin PrEP, you should first be tested for HIV. You should then be tested every 3 months for as long as you are taking PrEP.  Osteoporosis is a disease in which the bones lose minerals and strength   with aging. This can result in serious bone fractures or breaks. The risk of osteoporosis can be identified using a bone density scan. Women ages 65 years and over and women at risk for fractures or osteoporosis should discuss screening with their health care providers. Ask your health care provider whether you should take a calcium supplement or vitamin D to reduce the rate of osteoporosis.  Menopause can be associated with physical symptoms and risks. Hormone replacement therapy is available to decrease symptoms and risks. You should talk to your health care provider about whether hormone replacement therapy is right for you.  Use sunscreen. Apply sunscreen liberally and repeatedly throughout the day. You should seek shade when your shadow is shorter than you. Protect yourself by wearing long sleeves, pants, a wide-brimmed hat, and sunglasses year round, whenever you are outdoors.  Once a month, do a whole body skin exam, using a mirror to look at the skin on your back. Tell your health care provider of new moles, moles that have irregular borders, moles that are larger than a pencil eraser, or moles that have changed in shape or color.  Stay current with required vaccines (immunizations).  Influenza vaccine. All adults should be immunized every year.  Tetanus, diphtheria, and acellular pertussis (Td, Tdap) vaccine. Pregnant women should  receive 1 dose of Tdap vaccine during each pregnancy. The dose should be obtained regardless of the length of time since the last dose. Immunization is preferred during the 27th-36th week of gestation. An adult who has not previously received Tdap or who does not know her vaccine status should receive 1 dose of Tdap. This initial dose should be followed by tetanus and diphtheria toxoids (Td) booster doses every 10 years. Adults with an unknown or incomplete history of completing a 3-dose immunization series with Td-containing vaccines should begin or complete a primary immunization series including a Tdap dose. Adults should receive a Td booster every 10 years.  Varicella vaccine. An adult without evidence of immunity to varicella should receive 2 doses or a second dose if she has previously received 1 dose. Pregnant females who do not have evidence of immunity should receive the first dose after pregnancy. This first dose should be obtained before leaving the health care facility. The second dose should be obtained 4-8 weeks after the first dose.  Human papillomavirus (HPV) vaccine. Females aged 13-26 years who have not received the vaccine previously should obtain the 3-dose series. The vaccine is not recommended for use in pregnant females. However, pregnancy testing is not needed before receiving a dose. If a female is found to be pregnant after receiving a dose, no treatment is needed. In that case, the remaining doses should be delayed until after the pregnancy. Immunization is recommended for any person with an immunocompromised condition through the age of 26 years if she did not get any or all doses earlier. During the 3-dose series, the second dose should be obtained 4-8 weeks after the first dose. The third dose should be obtained 24 weeks after the first dose and 16 weeks after the second dose.  Zoster vaccine. One dose is recommended for adults aged 60 years or older unless certain conditions are  present.  Measles, mumps, and rubella (MMR) vaccine. Adults born before 1957 generally are considered immune to measles and mumps. Adults born in 1957 or later should have 1 or more doses of MMR vaccine unless there is a contraindication to the vaccine or there is laboratory evidence of immunity to   each of the three diseases. A routine second dose of MMR vaccine should be obtained at least 28 days after the first dose for students attending postsecondary schools, health care workers, or international travelers. People who received inactivated measles vaccine or an unknown type of measles vaccine during 1963-1967 should receive 2 doses of MMR vaccine. People who received inactivated mumps vaccine or an unknown type of mumps vaccine before 1979 and are at high risk for mumps infection should consider immunization with 2 doses of MMR vaccine. For females of childbearing age, rubella immunity should be determined. If there is no evidence of immunity, females who are not pregnant should be vaccinated. If there is no evidence of immunity, females who are pregnant should delay immunization until after pregnancy. Unvaccinated health care workers born before 1957 who lack laboratory evidence of measles, mumps, or rubella immunity or laboratory confirmation of disease should consider measles and mumps immunization with 2 doses of MMR vaccine or rubella immunization with 1 dose of MMR vaccine.  Pneumococcal 13-valent conjugate (PCV13) vaccine. When indicated, a person who is uncertain of her immunization history and has no record of immunization should receive the PCV13 vaccine. An adult aged 19 years or older who has certain medical conditions and has not been previously immunized should receive 1 dose of PCV13 vaccine. This PCV13 should be followed with a dose of pneumococcal polysaccharide (PPSV23) vaccine. The PPSV23 vaccine dose should be obtained at least 8 weeks after the dose of PCV13 vaccine. An adult aged 19  years or older who has certain medical conditions and previously received 1 or more doses of PPSV23 vaccine should receive 1 dose of PCV13. The PCV13 vaccine dose should be obtained 1 or more years after the last PPSV23 vaccine dose.  Pneumococcal polysaccharide (PPSV23) vaccine. When PCV13 is also indicated, PCV13 should be obtained first. All adults aged 65 years and older should be immunized. An adult younger than age 65 years who has certain medical conditions should be immunized. Any person who resides in a nursing home or long-term care facility should be immunized. An adult smoker should be immunized. People with an immunocompromised condition and certain other conditions should receive both PCV13 and PPSV23 vaccines. People with human immunodeficiency virus (HIV) infection should be immunized as soon as possible after diagnosis. Immunization during chemotherapy or radiation therapy should be avoided. Routine use of PPSV23 vaccine is not recommended for American Indians, [place] Natives, or people younger than 65 years unless there are medical conditions that require PPSV23 vaccine. When indicated, people who have unknown immunization and have no record of immunization should receive PPSV23 vaccine. One-time revaccination 5 years after the first dose of PPSV23 is recommended for people aged 19-64 years who have chronic kidney failure, nephrotic syndrome, asplenia, or immunocompromised conditions. People who received 1-2 doses of PPSV23 before age 65 years should receive another dose of PPSV23 vaccine at age 65 years or later if at least 5 years have passed since the previous dose. Doses of PPSV23 are not needed for people immunized with PPSV23 at or after age 65 years.  Meningococcal vaccine. Adults with asplenia or persistent complement component deficiencies should receive 2 doses of quadrivalent meningococcal conjugate (MenACWY-D) vaccine. The doses should be obtained at least 2 months apart.  Microbiologists working with certain meningococcal bacteria, military recruits, people at risk during an outbreak, and people who travel to or live in countries with a high rate of meningitis should be immunized. A first-year college student up through age   21 years who is living in a residence hall should receive a dose if she did not receive a dose on or after her 16th birthday. Adults who have certain high-risk conditions should receive one or more doses of vaccine.  Hepatitis A vaccine. Adults who wish to be protected from this disease, have certain high-risk conditions, work with hepatitis A-infected animals, work in hepatitis A research labs, or travel to or work in countries with a high rate of hepatitis A should be immunized. Adults who were previously unvaccinated and who anticipate close contact with an international adoptee during the first 60 days after arrival in the [place] from a country with a high rate of hepatitis A should be immunized.  Hepatitis B vaccine. Adults who wish to be protected from this disease, have certain high-risk conditions, may be exposed to blood or other infectious body fluids, are household contacts or sex partners of hepatitis B positive people, are clients or workers in certain care facilities, or travel to or work in countries with a high rate of hepatitis B should be immunized.  Haemophilus influenzae type b (Hib) vaccine. A previously unvaccinated person with asplenia or sickle cell disease or having a scheduled splenectomy should receive 1 dose of Hib vaccine. Regardless of previous immunization, a recipient of a hematopoietic stem cell transplant should receive a 3-dose series 6-12 months after her successful transplant. Hib vaccine is not recommended for adults with HIV infection. Preventive Services / Frequency Ages 64 to 68 years  Blood pressure check.** / Every 1 to 2 years.  Lipid and cholesterol check.** / Every 5 years beginning at age  22.  Clinical breast exam.** / Every 3 years for women in their 88s and 53s.  BRCA-related cancer risk assessment.** / For women who have family members with a BRCA-related cancer (breast, ovarian, tubal, or peritoneal cancers).  Pap test.** / Every 2 years from ages 90 through 51. Every 3 years starting at age 21 through age 56 or 3 with a history of 3 consecutive normal Pap tests.  HPV screening.** / Every 3 years from ages 24 through ages 1 to 46 with a history of 3 consecutive normal Pap tests.  Hepatitis C blood test.** / For any individual with known risks for hepatitis C.  Skin self-exam. / Monthly.  Influenza vaccine. / Every year.  Tetanus, diphtheria, and acellular pertussis (Tdap, Td) vaccine.** / Consult your health care provider. Pregnant women should receive 1 dose of Tdap vaccine during each pregnancy. 1 dose of Td every 10 years.  Varicella vaccine.** / Consult your health care provider. Pregnant females who do not have evidence of immunity should receive the first dose after pregnancy.  HPV vaccine. / 3 doses over 6 months, if 72 and younger. The vaccine is not recommended for use in pregnant females. However, pregnancy testing is not needed before receiving a dose.  Measles, mumps, rubella (MMR) vaccine.** / You need at least 1 dose of MMR if you were born in 1957 or later. You may also need a 2nd dose. For females of childbearing age, rubella immunity should be determined. If there is no evidence of immunity, females who are not pregnant should be vaccinated. If there is no evidence of immunity, females who are pregnant should delay immunization until after pregnancy.  Pneumococcal 13-valent conjugate (PCV13) vaccine.** / Consult your health care provider.  Pneumococcal polysaccharide (PPSV23) vaccine.** / 1 to 2 doses if you smoke cigarettes or if you have certain conditions.  Meningococcal vaccine.** /  1 dose if you are age 19 to 21 years and a first-year college  student living in a residence hall, or have one of several medical conditions, you need to get vaccinated against meningococcal disease. You may also need additional booster doses.  Hepatitis A vaccine.** / Consult your health care provider.  Hepatitis B vaccine.** / Consult your health care provider.  Haemophilus influenzae type b (Hib) vaccine.** / Consult your health care provider. Ages 40 to 64 years  Blood pressure check.** / Every 1 to 2 years.  Lipid and cholesterol check.** / Every 5 years beginning at age 20 years.  Lung cancer screening. / Every year if you are aged 55-80 years and have a 30-pack-year history of smoking and currently smoke or have quit within the past 15 years. Yearly screening is stopped once you have quit smoking for at least 15 years or develop a health problem that would prevent you from having lung cancer treatment.  Clinical breast exam.** / Every year after age 40 years.  BRCA-related cancer risk assessment.** / For women who have family members with a BRCA-related cancer (breast, ovarian, tubal, or peritoneal cancers).  Mammogram.** / Every year beginning at age 40 years and continuing for as long as you are in good health. Consult with your health care provider.  Pap test.** / Every 3 years starting at age 30 years through age 65 or 70 years with a history of 3 consecutive normal Pap tests.  HPV screening.** / Every 3 years from ages 30 years through ages 65 to 70 years with a history of 3 consecutive normal Pap tests.  Fecal occult blood test (FOBT) of stool. / Every year beginning at age 50 years and continuing until age 75 years. You may not need to do this test if you get a colonoscopy every 10 years.  Flexible sigmoidoscopy or colonoscopy.** / Every 5 years for a flexible sigmoidoscopy or every 10 years for a colonoscopy beginning at age 50 years and continuing until age 75 years.  Hepatitis C blood test.** / For all people born from 1945 through  1965 and any individual with known risks for hepatitis C.  Skin self-exam. / Monthly.  Influenza vaccine. / Every year.  Tetanus, diphtheria, and acellular pertussis (Tdap/Td) vaccine.** / Consult your health care provider. Pregnant women should receive 1 dose of Tdap vaccine during each pregnancy. 1 dose of Td every 10 years.  Varicella vaccine.** / Consult your health care provider. Pregnant females who do not have evidence of immunity should receive the first dose after pregnancy.  Zoster vaccine.** / 1 dose for adults aged 60 years or older.  Measles, mumps, rubella (MMR) vaccine.** / You need at least 1 dose of MMR if you were born in 1957 or later. You may also need a 2nd dose. For females of childbearing age, rubella immunity should be determined. If there is no evidence of immunity, females who are not pregnant should be vaccinated. If there is no evidence of immunity, females who are pregnant should delay immunization until after pregnancy.  Pneumococcal 13-valent conjugate (PCV13) vaccine.** / Consult your health care provider.  Pneumococcal polysaccharide (PPSV23) vaccine.** / 1 to 2 doses if you smoke cigarettes or if you have certain conditions.  Meningococcal vaccine.** / Consult your health care provider.  Hepatitis A vaccine.** / Consult your health care provider.  Hepatitis B vaccine.** / Consult your health care provider.  Haemophilus influenzae type b (Hib) vaccine.** / Consult your health care provider. Ages 65   years and over  Blood pressure check.** / Every 1 to 2 years.  Lipid and cholesterol check.** / Every 5 years beginning at age 22 years.  Lung cancer screening. / Every year if you are aged 73-80 years and have a 30-pack-year history of smoking and currently smoke or have quit within the past 15 years. Yearly screening is stopped once you have quit smoking for at least 15 years or develop a health problem that would prevent you from having lung cancer  treatment.  Clinical breast exam.** / Every year after age 4 years.  BRCA-related cancer risk assessment.** / For women who have family members with a BRCA-related cancer (breast, ovarian, tubal, or peritoneal cancers).  Mammogram.** / Every year beginning at age 40 years and continuing for as long as you are in good health. Consult with your health care provider.  Pap test.** / Every 3 years starting at age 9 years through age 34 or 91 years with 3 consecutive normal Pap tests. Testing can be stopped between 65 and 70 years with 3 consecutive normal Pap tests and no abnormal Pap or HPV tests in the past 10 years.  HPV screening.** / Every 3 years from ages 57 years through ages 64 or 45 years with a history of 3 consecutive normal Pap tests. Testing can be stopped between 65 and 70 years with 3 consecutive normal Pap tests and no abnormal Pap or HPV tests in the past 10 years.  Fecal occult blood test (FOBT) of stool. / Every year beginning at age 15 years and continuing until age 17 years. You may not need to do this test if you get a colonoscopy every 10 years.  Flexible sigmoidoscopy or colonoscopy.** / Every 5 years for a flexible sigmoidoscopy or every 10 years for a colonoscopy beginning at age 86 years and continuing until age 71 years.  Hepatitis C blood test.** / For all people born from [birth year] through 1964 and any individual with known risks for hepatitis C.  Osteoporosis screening.** / A one-time screening for women ages 83 years and over and women at risk for fractures or osteoporosis.  Skin self-exam. / Monthly.  Influenza vaccine. / Every year.  Tetanus, diphtheria, and acellular pertussis (Tdap/Td) vaccine.** / 1 dose of Td every 10 years.  Varicella vaccine.** / Consult your health care provider.  Zoster vaccine.** / 1 dose for adults aged 61 years or older.  Pneumococcal 13-valent conjugate (PCV13) vaccine.** / Consult your health care provider.  Pneumococcal  polysaccharide (PPSV23) vaccine.** / 1 dose for all adults aged 28 years and older.  Meningococcal vaccine.** / Consult your health care provider.  Hepatitis A vaccine.** / Consult your health care provider.  Hepatitis B vaccine.** / Consult your health care provider.  Haemophilus influenzae type b (Hib) vaccine.** / Consult your health care provider. ** Family history and personal history of risk and conditions may change your health care provider's recommendations. Document Released: 07/22/2001 Document Revised: 10/10/2013 Document Reviewed: 10/21/2010 [facility] Patient Information 2015 [place], [place]. This information is not intended to replace advice given to you by your health care provider. Make sure you discuss any questions you have with your health care provider.

## 2014-07-16 ENCOUNTER — Emergency Department (INDEPENDENT_AMBULATORY_CARE_PROVIDER_SITE_OTHER): Payer: 59

## 2014-07-16 ENCOUNTER — Encounter (HOSPITAL_COMMUNITY): Payer: Self-pay | Admitting: Emergency Medicine

## 2014-07-16 ENCOUNTER — Emergency Department (HOSPITAL_COMMUNITY)
Admission: EM | Admit: 2014-07-16 | Discharge: 2014-07-16 | Disposition: A | Discharge status: Home / Self Care | Payer: 59 | Source: Home / Self Care | Attending: Emergency Medicine | Admitting: Emergency Medicine

## 2014-07-16 DIAGNOSIS — M25572 Pain in left ankle and joints of left foot: Secondary | ICD-10-CM

## 2014-07-16 NOTE — ED Provider Notes (Signed)
CSN: [id number]     Arrival date & time 07/16/14  0935 History   First MD Initiated Contact with Patient 07/16/14 [phone]     Chief Complaint  Patient presents with  . Ankle Pain   (Consider location/radiation/quality/duration/timing/severity/associated sxs/prior Treatment) HPI           54 year old female presents for evaluation of left ankle injury. She was exercising this morning when she rolled her ankle, inversion injury. She has pain and swelling in the lateral foot. Pain increases with moving her toes. She also has pain through the arch of her foot. She has been ambulatory but with severe pain. No numbness. No other injury. No history of ankle injuries or sprains  Past Medical History  Diagnosis Date  . Ovarian cyst   . IBS (irritable bowel syndrome)   . Toenail fungus    Past Surgical History  Procedure Laterality Date  . Ovarian cyst surgery     Family History  Problem Relation Age of Onset  . Gallbladder disease Mother     Gallbladder Cancer  . Irritable bowel syndrome Mother   . Diabetes Mother   . Cancer Mother     GB, liver mets  . Hyperlipidemia Mother   . Hypertension Mother   . Coronary artery disease    . Diabetes Father   . Hypertension Father   . Hyperlipidemia Father   . Cancer Brother 46    lymphoma   History  Substance Use Topics  . Smoking status: Never Smoker   . Smokeless tobacco: Never Used  . Alcohol Use: No   OB History    No data available     Review of Systems  Musculoskeletal: Positive for joint swelling and arthralgias.  All other systems reviewed and are negative.   Allergies  Review of patient's allergies indicates no known allergies.  Home Medications   Prior to Admission medications   Medication Sig Start Date End Date Taking? Authorizing Provider  aspirin 81 MG tablet Take 81 mg by mouth daily.    Historical Provider, MD  calcium carbonate (OS-CAL) 600 MG TABS Take 600 mg by mouth daily.    Historical Provider, MD   cholecalciferol (VITAMIN D) 1000 UNITS tablet Take 1,000 Units by mouth daily.    Historical Provider, MD  Clobetasol Prop Emollient Base 0.05 % emollient cream Apply topically 2 (two) times daily.    Historical Provider, MD  co-enzyme Q-10 30 MG capsule Take 30 mg by mouth daily.     Historical Provider, MD  Cranberry 140-100-3 MG-MG-UNIT CAPS Take 1 capsule by mouth daily.    Historical Provider, MD  Dermatological Products, [place]. (NUVAIL EX) Apply topically.    Historical Provider, MD  Folic Acid 093 MCG TABS Take 1 tablet by mouth daily.    Historical Provider, MD  nitrofurantoin, macrocrystal-monohydrate, (MACROBID) 100 MG capsule Take 1 capsule (100 mg total) by mouth as needed. 07/06/14   Rosalita Chessman, DO  Potassium (POTASSIMIN PO) Take 1 tablet by mouth daily.    Historical Provider, MD  vitamin B-12 (CYANOCOBALAMIN) 100 MCG tablet Take 100 mcg by mouth daily.    Historical Provider, MD   BP 132/64 mmHg  Pulse 93  Temp(Src) 97.9 F (36.6 C) (Oral)  Resp 18  SpO2 100%  LMP 06/13/2014 Physical Exam  Constitutional: She is oriented to person, place, and time. Vital signs are normal. She appears well-developed and well-nourished. No distress.  HENT:  Head: Normocephalic and atraumatic.  Pulmonary/Chest: Effort normal. No respiratory  distress.  Musculoskeletal:       Left ankle: She exhibits decreased range of motion, swelling and ecchymosis. Tenderness. Lateral malleolus tenderness found.       Left foot: There is tenderness (base of 5th) and swelling.  Neurological: She is alert and oriented to person, place, and time. She has normal strength. Coordination normal.  Skin: Skin is warm and dry. No rash noted. She is not diaphoretic.  Psychiatric: She has a normal mood and affect. Judgment normal.  Nursing note and vitals reviewed.   ED Course  Procedures (including critical care time) Labs Review Labs Reviewed - No data to display  Imaging Review Dg Ankle Complete  Left  07/16/2014   CLINICAL DATA:  Left arch pain as well as heel and lateral ankle pain. Twisting injury this morning.  EXAM: LEFT ANKLE COMPLETE - 3+ VIEW  COMPARISON:  None.  FINDINGS: There is no evidence of fracture, dislocation, or joint effusion. There is no evidence of arthropathy or other focal bone abnormality. Soft tissues are unremarkable.  IMPRESSION: Negative.   Electronically Signed   By: Marin Olp M.D.   On: 07/16/2014 11:00   Dg Foot Complete Left  07/16/2014   CLINICAL DATA:  Initial encounter for Pain and swelling after twisting injury this morning.  EXAM: LEFT FOOT - COMPLETE 3+ VIEW  COMPARISON:  Ankle films, dictated separately  FINDINGS: No acute fracture or dislocation. Soft tissue swelling is identified laterally and anterior to the tibiotalar joint. Minimal osseous irregularity about the lateral aspect of the hindfoot could relate to accessory ossicles. No definite donor site identified. Base of fifth metatarsal intact.  IMPRESSION: Extensive soft tissue swelling, including anteriorly and laterally about the hindfoot. Osseous irregularity about the lateral hindfoot could relate to accessory ossicles. Tiny avulsion fracture or fractures cannot be entirely excluded. Correlate with point tenderness. No definite donor site identified.   Electronically Signed   By: Abigail Miyamoto M.D.   On: 07/16/2014 11:02     MDM   1. Ankle pain, left    Possible avulsion fracture, treated with walking boot, crutches, toe-touch weightbearing as tolerated, follow-up with orthopedics      Liam Graham, PA-C 07/17/14 [phone]

## 2014-07-16 NOTE — ED Notes (Signed)
Left ankle pain and swelling.  Rolled left ankle in an exercise class this am.  Pedal pulses 2 plus.

## 2014-07-16 NOTE — Discharge Instructions (Signed)

## 2014-07-27 ENCOUNTER — Encounter: Payer: Self-pay | Admitting: Family Medicine

## 2014-09-21 ENCOUNTER — Encounter: Payer: Self-pay | Admitting: Internal Medicine

## 2015-02-12 ENCOUNTER — Encounter: Payer: Self-pay | Admitting: Family Medicine

## 2015-06-12 DIAGNOSIS — H5213 Myopia, bilateral: Secondary | ICD-10-CM | POA: Diagnosis not present

## 2015-06-12 DIAGNOSIS — H524 Presbyopia: Secondary | ICD-10-CM | POA: Diagnosis not present

## 2015-06-12 DIAGNOSIS — H52223 Regular astigmatism, bilateral: Secondary | ICD-10-CM | POA: Diagnosis not present

## 2015-06-28 ENCOUNTER — Other Ambulatory Visit: Payer: Self-pay

## 2015-06-28 DIAGNOSIS — Z1231 Encounter for screening mammogram for malignant neoplasm of breast: Secondary | ICD-10-CM

## 2015-07-06 MED FILL — PREVIDENT 5000 SENSITIVE PA: 1.1-5 | 20 days supply | Qty: 100 | Fill #0

## 2015-07-16 ENCOUNTER — Ambulatory Visit
Admission: RE | Admit: 2015-07-16 | Discharge: 2015-07-16 | Disposition: A | Discharge status: Home / Self Care | Payer: 59 | Source: Ambulatory Visit

## 2015-07-16 DIAGNOSIS — Z1231 Encounter for screening mammogram for malignant neoplasm of breast: Secondary | ICD-10-CM

## 2015-07-19 ENCOUNTER — Other Ambulatory Visit: Payer: Self-pay | Admitting: Family Medicine

## 2015-07-19 DIAGNOSIS — R928 Other abnormal and inconclusive findings on diagnostic imaging of breast: Secondary | ICD-10-CM

## 2015-07-24 ENCOUNTER — Ambulatory Visit
Admission: RE | Admit: 2015-07-24 | Discharge: 2015-07-24 | Disposition: A | Discharge status: Home / Self Care | Payer: 59 | Source: Ambulatory Visit | Attending: Family Medicine | Admitting: Family Medicine

## 2015-07-24 DIAGNOSIS — R922 Inconclusive mammogram: Secondary | ICD-10-CM | POA: Diagnosis not present

## 2015-07-24 DIAGNOSIS — R928 Other abnormal and inconclusive findings on diagnostic imaging of breast: Secondary | ICD-10-CM

## 2015-07-24 DIAGNOSIS — N6001 Solitary cyst of right breast: Secondary | ICD-10-CM | POA: Diagnosis not present

## 2015-07-27 ENCOUNTER — Telehealth: Payer: Self-pay | Admitting: Family Medicine

## 2015-07-27 NOTE — Telephone Encounter (Signed)
Patient was scheduled for 08/31/15 an she does not want to wait until June for a CPE, is it ok to schedule her somewhere else sooner. We have no CPE's open until June. Please advise if we can put 2 15 min appointments together, 11:30 or 6 pm. Please advise      KP

## 2015-07-27 NOTE — Telephone Encounter (Signed)
Spoke with pt to reschedule her CPE appt. Pt says that she doesn't want to wait until June (next available appt) to get CPE Pt would like to know if PCP could work her in sooner.   Please advise further.    CB: [phone]

## 2015-07-27 NOTE — Telephone Encounter (Signed)
Put her in at 11:30.    KP

## 2015-07-27 NOTE — Telephone Encounter (Signed)
11:30 is fine

## 2015-07-30 NOTE — Telephone Encounter (Signed)
Called pt to schedule.LVM

## 2015-07-30 NOTE — Telephone Encounter (Signed)
Pt called back and stated that she is going to change to a doctor closer to her work. They will be within [facility].

## 2015-08-06 MED FILL — AMOXICILLIN 250 MG CAPSULE: 250 | 10 days supply | Qty: 30 | Fill #0

## 2015-08-14 MED FILL — AMOXICILLIN 250 MG CAPSULE: 250 | 10 days supply | Qty: 30 | Fill #1

## 2015-08-28 ENCOUNTER — Encounter: Payer: 59 | Attending: Family Medicine | Admitting: Dietician

## 2015-08-28 ENCOUNTER — Encounter: Payer: Self-pay | Admitting: Dietician

## 2015-08-28 VITALS — Ht 68.0 in | Wt 204.0 lb

## 2015-08-28 DIAGNOSIS — Z713 Dietary counseling and surveillance: Secondary | ICD-10-CM | POA: Diagnosis not present

## 2015-08-28 DIAGNOSIS — K219 Gastro-esophageal reflux disease without esophagitis: Secondary | ICD-10-CM

## 2015-08-28 DIAGNOSIS — Z6830 Body mass index (BMI) 30.0-30.9, adult: Secondary | ICD-10-CM

## 2015-08-28 NOTE — Progress Notes (Signed)
  Medical Nutrition Therapy:  Appt start time: 0800 end time:  0915.   Assessment:  Primary concerns today: Patient is here alone.  She is concerned about her weight.  "Exercising more and eating less and unable to lose and gaining at times."  She also is concerned with possible low blood sugar symptoms (sweaty,shaky).  She also reports more frequent reflux.  "improved with carbonation.  Weight hx: Highest 225 lbs- lost with boot camp bid and trainer 1x/wee and lost to 194 lbs until she sprained her ankle. Lowest 145 lbs Today:  203.5 lbs Her goal:  175 lbs.  Patient lives with her boyfriend.  Neither like to cook and they eat out frequently at "mom and pop" restaurants.  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  States that she has less energy and motivation as she ages and boyfriend feels the same.  TANITA  BODY COMP RESULTS 08/28/15 203.5   BMI (kg/m^2) 30.9   Fat Mass (lbs) 88.5   Fat Free Mass (lbs) 115   Total Body Water (lbs) 84   Preferred Learning Style:   No preference indicated   Learning Readiness:   Ready  Change in progress  MEDICATIONS: see list   DIETARY INTAKE:  24-hr recall:  B ( AM): greek yogurt week days and weekends:  Scrambled eggs, bacon, toast or waffles or egg sandwich Snk ( AM): none L ( PM): greek yogurt and nabs and dark chocolate (4 Dove) or out to eat once a week Snk ( PM): none unless someone brings something in D ( PM): McDonalds double burger and occasional fries or cereal and milk or 6" sub or pizza Snk ( PM): dark chocolate or ice cream cone or ice cream sandwich or other sweet or chips, lately more fruit Beverages: flavored water, "can't drink plain water", decaf tea (1 tsp sugar per cup), juice, decaf diet Mt. Dew.  [place] vanilla protein drink after working out.  Usual physical activity: Cardio sculp and spin classes at [facility].  Walks the dog 1-1.5 miles twice a week.  Tracking 2800 steps daily.  Estimated energy  needs: 1600 calories 180 g carbohydrates 120 g protein 44 g fat  Progress Towards Goal(s):  In progress.   Nutritional Diagnosis:  NB-1.1 Food and nutrition-related knowledge deficit As related to reflux, intake/energy expenditure, label reading.  As evidenced by diet hx adn patient report.    Intervention:  Nutrition counseling/education related to healthy eating for weight management.  Discussed mindful eating, importance of increasing activity, tips to improve metabolism.  Discussed guidelines to improve reflux.  Encouraged patient to increase her vegetable intake and include a small amount of lean protein each time she eats.  She does not like many vegetables and was somewhat averse to this.  Continue to rethink your drink. Be as active as possible.  Generally 30 minutes per day for health.  One hour most days for weight loss.  Get the [place] out again. Listen to your body.  Stop when you are full. Keep it lean. Avoid eating for 2-3 hours prior to laying down.  Teaching Method Utilized:  Visual Auditory Hands on  Handouts given during visit include:  Snack list  Label reading  Nutrition therapy for GERD from [facility]  Barriers to learning/adherence to lifestyle change: motivation/fatigue  Demonstrated degree of understanding via:  Teach Back   Monitoring/Evaluation:  Dietary intake, exercise, label reading, and body weight prn.

## 2015-08-28 NOTE — Patient Instructions (Addendum)
Continue to rethink your drink. Be as active as possible.  Generally 30 minutes per day for health.  One hour most days for weight loss.  Get the [place] out again. Listen to your body.  Stop when you are full. Keep it lean. Avoid eating for 2-3 hours prior to laying down.

## 2015-08-31 ENCOUNTER — Other Ambulatory Visit: Payer: Self-pay | Admitting: Family Medicine

## 2015-08-31 ENCOUNTER — Other Ambulatory Visit (HOSPITAL_COMMUNITY)
Admission: RE | Admit: 2015-08-31 | Discharge: 2015-08-31 | Disposition: A | Discharge status: Home / Self Care | Payer: 59 | Source: Ambulatory Visit | Attending: Family Medicine | Admitting: Family Medicine

## 2015-08-31 ENCOUNTER — Encounter: Payer: Self-pay | Admitting: Family Medicine

## 2015-08-31 DIAGNOSIS — Z1151 Encounter for screening for human papillomavirus (HPV): Secondary | ICD-10-CM | POA: Diagnosis not present

## 2015-08-31 DIAGNOSIS — N390 Urinary tract infection, site not specified: Secondary | ICD-10-CM | POA: Diagnosis not present

## 2015-08-31 DIAGNOSIS — Z124 Encounter for screening for malignant neoplasm of cervix: Secondary | ICD-10-CM | POA: Diagnosis not present

## 2015-08-31 DIAGNOSIS — Z0000 Encounter for general adult medical examination without abnormal findings: Secondary | ICD-10-CM | POA: Diagnosis not present

## 2015-08-31 DIAGNOSIS — Z01419 Encounter for gynecological examination (general) (routine) without abnormal findings: Secondary | ICD-10-CM | POA: Diagnosis not present

## 2015-08-31 DIAGNOSIS — Z13220 Encounter for screening for lipoid disorders: Secondary | ICD-10-CM | POA: Diagnosis not present

## 2015-08-31 DIAGNOSIS — Z1211 Encounter for screening for malignant neoplasm of colon: Secondary | ICD-10-CM | POA: Diagnosis not present

## 2015-09-03 LAB — CYTOLOGY - PAP

## 2015-09-03 MED FILL — NITROFURANTOIN MONO-MCR 100: 100 | 15 days supply | Qty: 30 | Fill #0

## 2015-09-04 MED FILL — PREVIDENT 5000 SENSITIVE PA: 1.1-5 | 20 days supply | Qty: 100 | Fill #1

## 2015-09-13 ENCOUNTER — Encounter: Payer: Self-pay | Admitting: Gastroenterology

## 2015-09-13 ENCOUNTER — Encounter: Payer: Self-pay | Admitting: Internal Medicine

## 2015-11-20 ENCOUNTER — Encounter: Payer: Self-pay | Admitting: Internal Medicine

## 2015-11-20 ENCOUNTER — Ambulatory Visit (INDEPENDENT_AMBULATORY_CARE_PROVIDER_SITE_OTHER): Payer: 59 | Admitting: Internal Medicine

## 2015-11-20 VITALS — BP 126/80 | HR 71 | Ht 68.0 in | Wt 206.2 lb

## 2015-11-20 DIAGNOSIS — K219 Gastro-esophageal reflux disease without esophagitis: Secondary | ICD-10-CM

## 2015-11-20 DIAGNOSIS — Z1211 Encounter for screening for malignant neoplasm of colon: Secondary | ICD-10-CM

## 2015-11-20 MED ORDER — METOCLOPRAMIDE HCL 10 MG PO TABS: 10.0000 mg | ORAL_TABLET | ORAL | Status: DC

## 2015-11-20 NOTE — Progress Notes (Signed)
Patient ID: Emily Mejia, female   DOB: September 30, 1960, 55 y.o.   MRN: [id number] HPI: [name] is a 55 year old female with a past medical history of IBS with constipation who is seen in consultation at the request of Marilynne Drivers, PA-C to discuss repeat screening colonoscopy. She is here alone today. She had a colonoscopy performed with Dr. Sharlett Iles on 11/29/2012. This was very limited due to an extremely poor prep. He recommend follow-up colonoscopy in one year with a 2 day prep but this never occurred. She reports she has not rescheduled because of fear of the bowel preparation. She denies GI complaint though she has chronic constipation with mild irritable bowel. She goes to the bathroom about 2 times per week. She denies any abdominal pain. Denies blood in her stool. Denies melena. Denies change in bowel habit. She has been having reflux symptoms predominantly at night. This is burning sour brash. No dysphagia or odynophagia. No hepatobiliary complaint.  She reports with the previous bowel prep she used moviprep and had considerable upper abdominal bloating fullness with nausea and vomiting. She feels she vomited a considerable portion of the preparation.  Past Medical History  Diagnosis Date  . Ovarian cyst   . IBS (irritable bowel syndrome)   . Toenail fungus     Past Surgical History  Procedure Laterality Date  . Ovarian cyst surgery      Outpatient Prescriptions Prior to Visit  Medication Sig Dispense Refill  . aspirin 81 MG tablet Take 81 mg by mouth daily.    . calcium carbonate (OS-CAL) 600 MG TABS Take 600 mg by mouth daily.    . cholecalciferol (VITAMIN D) 1000 UNITS tablet Take 1,000 Units by mouth daily.    . Clobetasol Prop Emollient Base 0.05 % emollient cream Apply topically 2 (two) times daily. Reported on 08/28/2015    . co-enzyme Q-10 30 MG capsule Take 30 mg by mouth daily.     . Cranberry 140-100-3 MG-MG-UNIT CAPS Take 1 capsule by mouth daily.    . Dermatological  Products, Misc. (NUVAIL EX) Apply topically.    . Folic Acid A999333 MCG TABS Take 1 tablet by mouth daily.    . nitrofurantoin, macrocrystal-monohydrate, (MACROBID) 100 MG capsule Take 1 capsule (100 mg total) by mouth as needed. 30 capsule 5  . Potassium (POTASSIMIN PO) Take 1 tablet by mouth daily.    . vitamin B-12 (CYANOCOBALAMIN) 100 MCG tablet Take 100 mcg by mouth daily.     No facility-administered medications prior to visit.    No Known Allergies  Family History  Problem Relation Age of Onset  . Gallbladder disease Mother     Gallbladder Cancer  . Irritable bowel syndrome Mother   . Diabetes Mother   . Cancer Mother     GB, liver mets  . Hyperlipidemia Mother   . Hypertension Mother   . Coronary artery disease    . Diabetes Father   . Hypertension Father   . Hyperlipidemia Father   . Cancer Brother 49    lymphoma  . Aneurysm Sister   . Aneurysm Paternal Uncle     Social History  Substance Use Topics  . Smoking status: Never Smoker   . Smokeless tobacco: Never Used  . Alcohol Use: No    ROS: As per history of present illness, otherwise negative  BP 126/80 mmHg  Pulse 71  Ht 5\' 8"  (1.727 m)  Wt 206 lb 3.2 oz (93.532 kg)  BMI 31.36 kg/m2  SpO2 97%  Constitutional: Well-developed and well-nourished. No distress. HEENT: Normocephalic and atraumatic. Oropharynx is clear and moist. No oropharyngeal exudate. Conjunctivae are normal.  No scleral icterus. Neck: Neck supple. Trachea midline. Cardiovascular: Normal rate, regular rhythm and intact distal pulses. No M/R/G Pulmonary/chest: Effort normal and breath sounds normal. No wheezing, rales or rhonchi. Abdominal: Soft, nontender, nondistended. Bowel sounds active throughout. There are no masses palpable. No hepatosplenomegaly. Extremities: no clubbing, cyanosis, or edema Lymphadenopathy: No cervical adenopathy noted. Neurological: Alert and oriented to person place and time. Skin: Skin is warm and dry. No rashes  noted. Psychiatric: Normal mood and affect. Behavior is normal.  RELEVANT LABS AND IMAGING: CBC    Component Value Date/Time   WBC 6.8 07/06/2014 0927   RBC 5.24* 07/06/2014 0927   HGB 14.8 07/06/2014 0927   HCT 43.0 07/06/2014 0927   PLT 283.0 07/06/2014 0927   MCV 82.0 07/06/2014 0927   MCHC 34.4 07/06/2014 0927   RDW 13.8 07/06/2014 0927   LYMPHSABS 2.2 07/06/2014 0927   MONOABS 0.6 07/06/2014 0927   EOSABS 0.1 07/06/2014 0927   BASOSABS 0.1 07/06/2014 0927    CMP     Component Value Date/Time   NA 139 07/06/2014 0927   K 4.2 07/06/2014 0927   CL 104 07/06/2014 0927   CO2 26 07/06/2014 0927   GLUCOSE 85 07/06/2014 0927   BUN 15 07/06/2014 0927   CREATININE 0.90 07/06/2014 0927   CALCIUM 9.3 07/06/2014 0927   PROT 7.4 07/06/2014 0927   ALBUMIN 4.4 07/06/2014 0927   AST 17 07/06/2014 0927   ALT 11 07/06/2014 0927   ALKPHOS 52 07/06/2014 0927   BILITOT 0.6 07/06/2014 0927   GFRNONAA 81.62 06/28/2009 [id number]    ASSESSMENT/PLAN: 55 year old female with a past medical history of IBS with constipation who is seen in consultation at the request of Marilynne Drivers, PA-C to discuss repeat screening colonoscopy.  1. CRC screening -- we discussed her options for colorectal cancer screening and she is hesitant to undergo bowel preparation again due to the volume of the prep and the nausea and vomiting. We discussed that we are now splitting the bowel preparation and she would benefit from metoclopramide 10 mg before each half of the prep. We also discussed other options including Cologuard. She would like to research Cologuard to see if this would be covered by her insurance. If she does decide to proceed with colonoscopy we would plan a bowel preparation as follows. On the evening 2 days before her procedure she would do one bottle of magnesium citrate. The day prior to her procedure she would have a clear liquid day. We will proceed with MiraLAX prep in split fashion with metoclopramide  10 mg 30 minutes to an hour before each half of the prep. If understands that if Cologuard as positive she would need colonoscopy. If it is negative, current recommendation is to repeat every 3 years. Colonoscopy will be scheduled today but may be canceled should she decide to proceed with Cologuard. She will let me know.  2. GERD -- mild and usually nocturnal. I recommended ranitidine 150 mg every afternoon on an as-needed basis. If this fails to improve symptoms she is asked to notify me. She voices understanding. She is already been advised to follow a GERD diet, avoid eating within 2 hours of lying down. She's also raise the head of her bed.   [id number] [facility]

## 2015-11-20 NOTE — Patient Instructions (Addendum)
You have been scheduled for a colonoscopy. Please follow written instructions given to you at your visit today.  Please pick up your prep supplies at the pharmacy within the next 1-3 days. If you use inhalers (even only as needed), please bring them with you on the day of your procedure. Your physician has requested that you go to www.[email or web address].[email or web address] and enter the access code given to you at your visit today. This web site gives a general overview about your procedure. However, you should still follow specific instructions given to you by our office regarding your preparation for the procedure.  Should you decide to go forward with Cologuard instead of colonoscopy, please let us know.  If you are age 21 or older, your body mass index should be between 23-30. Your Body mass index is 31.36 kg/(m^2). If this is out of the aforementioned range listed, please consider follow up with your Primary Care Provider.  If you are age 21 or younger, your body mass index should be between 19-25. Your Body mass index is 31.36 kg/(m^2). If this is out of the aformentioned range listed, please consider follow up with your Primary Care Provider.

## 2015-11-30 MED FILL — PREVIDENT 5000 SENSITIVE PA: 1.1-5 | 20 days supply | Qty: 100 | Fill #2

## 2015-12-03 MED FILL — METOCLOPRAMIDE 10 MG TABLET: 10 | 2 days supply | Qty: 2 | Fill #0

## 2015-12-31 ENCOUNTER — Other Ambulatory Visit: Payer: Self-pay | Admitting: Family Medicine

## 2015-12-31 DIAGNOSIS — N6489 Other specified disorders of breast: Secondary | ICD-10-CM

## 2016-01-20 ENCOUNTER — Encounter: Payer: Self-pay | Admitting: Internal Medicine

## 2016-01-21 ENCOUNTER — Encounter: Payer: Self-pay | Admitting: Internal Medicine

## 2016-01-21 ENCOUNTER — Ambulatory Visit (AMBULATORY_SURGERY_CENTER): Payer: 59 | Admitting: Internal Medicine

## 2016-01-21 VITALS — BP 115/72 | HR 80 | Temp 98.2°F | Resp 13 | Ht 68.0 in | Wt 206.0 lb

## 2016-01-21 DIAGNOSIS — D122 Benign neoplasm of ascending colon: Secondary | ICD-10-CM | POA: Diagnosis not present

## 2016-01-21 DIAGNOSIS — D125 Benign neoplasm of sigmoid colon: Secondary | ICD-10-CM | POA: Diagnosis not present

## 2016-01-21 DIAGNOSIS — Z1211 Encounter for screening for malignant neoplasm of colon: Secondary | ICD-10-CM | POA: Diagnosis present

## 2016-01-21 DIAGNOSIS — K635 Polyp of colon: Secondary | ICD-10-CM

## 2016-01-21 DIAGNOSIS — M545 Low back pain: Secondary | ICD-10-CM | POA: Diagnosis not present

## 2016-01-21 DIAGNOSIS — D120 Benign neoplasm of cecum: Secondary | ICD-10-CM | POA: Diagnosis not present

## 2016-01-21 DIAGNOSIS — K589 Irritable bowel syndrome without diarrhea: Secondary | ICD-10-CM | POA: Diagnosis not present

## 2016-01-21 HISTORY — PX: COLONOSCOPY: SHX174

## 2016-01-21 MED ORDER — SODIUM CHLORIDE 0.9 % IV SOLN: 500.0000 mL | INTRAVENOUS | Status: DC

## 2016-01-21 NOTE — Patient Instructions (Signed)
Impression/recommendations:  Biopsy of ileocecal valve. Polyps (handout given)  Repeat colonoscopy pending pathology results.  YOU HAD AN ENDOSCOPIC PROCEDURE TODAY AT [facility]:   Refer to the procedure report that was given to you for any specific questions about what was found during the examination.  If the procedure report does not answer your questions, please call your gastroenterologist to clarify.  If you requested that your care partner not be given the details of your procedure findings, then the procedure report has been included in a sealed envelope for you to review at your convenience later.  YOU SHOULD EXPECT: Some feelings of bloating in the abdomen. Passage of more gas than usual.  Walking can help get rid of the air that was put into your GI tract during the procedure and reduce the bloating. If you had a lower endoscopy (such as a colonoscopy or flexible sigmoidoscopy) you may notice spotting of blood in your stool or on the toilet paper. If you underwent a bowel prep for your procedure, you may not have a normal bowel movement for a few days.  Please Note:  You might notice some irritation and congestion in your nose or some drainage.  This is from the oxygen used during your procedure.  There is no need for concern and it should clear up in a day or so.  SYMPTOMS TO REPORT IMMEDIATELY:   Following lower endoscopy (colonoscopy or flexible sigmoidoscopy):  Excessive amounts of blood in the stool  Significant tenderness or worsening of abdominal pains  Swelling of the abdomen that is new, acute  Fever of 100F or higher   For urgent or emergent issues, a gastroenterologist can be reached at any hour by calling [phone].   DIET: Your first meal following the procedure should be a small meal and then it is ok to progress to your normal diet. Heavy or fried foods are harder to digest and may make you feel nauseous or bloated.  Likewise, meals heavy  in dairy and vegetables can increase bloating.  Drink plenty of fluids but you should avoid alcoholic beverages for 24 hours.  ACTIVITY:  You should plan to take it easy for the rest of today and you should NOT DRIVE or use heavy machinery until tomorrow (because of the sedation medicines used during the test).    FOLLOW UP: Our staff will call the number listed on your records the next business day following your procedure to check on you and address any questions or concerns that you may have regarding the information given to you following your procedure. If we do not reach you, we will leave a message.  However, if you are feeling well and you are not experiencing any problems, there is no need to return our call.  We will assume that you have returned to your regular daily activities without incident.  If any biopsies were taken you will be contacted by phone or by letter within the next 1-3 weeks.  Please call us at [phone] if you have not heard about the biopsies in 3 weeks.    SIGNATURES/CONFIDENTIALITY: You and/or your care partner have signed paperwork which will be entered into your electronic medical record.  These signatures attest to the fact that that the information above on your After Visit Summary has been reviewed and is understood.  Full responsibility of the confidentiality of this discharge information lies with you and/or your care-partner.

## 2016-01-21 NOTE — Progress Notes (Signed)
Report given to PACU RN, vss 

## 2016-01-21 NOTE — Op Note (Signed)
[place] Patient Name: Emily Mejia Procedure Date: 01/21/2016 1:20 PM MRN: [id number] Endoscopist: Jerene Bears , MD Age: 55 Referring MD:  Date of Birth: [birth year] Gender: Female Account #: [id number] Procedure:                Colonoscopy Indications:              Screening for colorectal malignant neoplasm, Last                            colonoscopy: June 2013 with poor preparation Medicines:                Monitored Anesthesia Care Procedure:                Pre-Anesthesia Assessment:                           - Prior to the procedure, a History and Physical                            was performed, and patient medications and                            allergies were reviewed. The patient's tolerance of                            previous anesthesia was also reviewed. The risks                            and benefits of the procedure and the sedation                            options and risks were discussed with the patient.                            All questions were answered, and informed consent                            was obtained. Prior Anticoagulants: The patient has                            taken no previous anticoagulant or antiplatelet                            agents. ASA Grade Assessment: II - A patient with                            mild systemic disease. After reviewing the risks                            and benefits, the patient was deemed in                            satisfactory condition to undergo the procedure.  After obtaining informed consent, the colonoscope                            was passed under direct vision. Throughout the                            procedure, the patient's blood pressure, pulse, and                            oxygen saturations were monitored continuously. The                            Model PCF-H190L [id number]) scope was introduced                            through the anus  and advanced to the the terminal                            ileum. The colonoscopy was performed without                            difficulty. The patient tolerated the procedure                            well. The quality of the bowel preparation was                            good. The terminal ileum, ileocecal valve,                            appendiceal orifice, and rectum were photographed.                            The bowel preparation used was 2 day with Mg                            Citrate and split dose MiraLax prep. Scope In: 1:44:04 PM Scope Out: 2:15:41 PM Scope Withdrawal Time: 0 hours 27 minutes 0 seconds  Total Procedure Duration: 0 hours 31 minutes 37 seconds  Findings:                 The perianal and digital rectal examinations were                            normal.                           The terminal ileum appeared normal.                           An area of granular mucosa was found at the                            ileocecal valve. This area very subtly polypoid and  appeared to extend from ileal mucosa. Multiple                            biopsies were obtained with cold forceps for                            histology in a targeted manner at the ileocecal                            valve to rule out flat adenoma.                           A 10 mm polyp was found in the ascending colon. The                            polyp was sessile. The polyp was removed with a                            cold snare. Resection and retrieval were complete.                           Two sessile polyps were found in the recto-sigmoid                            colon. The polyps were 3 to 4 mm in size. These                            polyps were removed with a cold snare. Resection                            and retrieval were complete. Complications:            No immediate complications. Estimated Blood Loss:     Estimated blood loss was  minimal. Impression:               - The examined portion of the ileum was normal.                           - Granularity at the ileocecal valve. Biopsied to                            exclude flat adenoma.                           - One 10 mm polyp in the ascending colon, removed                            with a cold snare. Resected and retrieved.                           - Two 3 to 4 mm polyps at the recto-sigmoid colon,  removed with a cold snare. Resected and retrieved. Recommendation:           - Patient has a contact number available for                            emergencies. The signs and symptoms of potential                            delayed complications were discussed with the                            patient. Return to normal activities tomorrow.                            Written discharge instructions were provided to the                            patient.                           - Resume previous diet.                           - Continue present medications.                           - Await pathology results.                           - Repeat colonoscopy is recommended. The                            colonoscopy date will be determined after pathology                            results from today's exam become available for                            review. Jerene Bears, MD 01/21/2016 2:26:06 PM This report has been signed electronically.

## 2016-01-21 NOTE — Progress Notes (Signed)
Called to room to assist during endoscopic procedure.  Patient ID and intended procedure confirmed with present staff. Received instructions for my participation in the procedure from the performing physician.  

## 2016-01-22 ENCOUNTER — Telehealth: Payer: Self-pay | Admitting: *Deleted

## 2016-01-22 NOTE — Telephone Encounter (Signed)
  Follow up Call-  Call back number 01/21/2016  Post procedure Call Back phone  # [phone]  Permission to leave phone message Yes  Some recent data might be hidden     Patient questions:  Do you have a fever, pain , or abdominal swelling? No. Pain Score  0 *  Have you tolerated food without any problems? Yes.    Have you been able to return to your normal activities? Yes.    Do you have any questions about your discharge instructions: Diet   No. Medications  No. Follow up visit  No.  Do you have questions or concerns about your Care? No.  Actions: * If pain score is 4 or above: No action needed, pain <4.

## 2016-01-25 ENCOUNTER — Ambulatory Visit
Admission: RE | Admit: 2016-01-25 | Discharge: 2016-01-25 | Disposition: A | Discharge status: Home / Self Care | Payer: 59 | Source: Ambulatory Visit | Attending: Family Medicine | Admitting: Family Medicine

## 2016-01-25 DIAGNOSIS — R922 Inconclusive mammogram: Secondary | ICD-10-CM | POA: Diagnosis not present

## 2016-01-25 DIAGNOSIS — N6489 Other specified disorders of breast: Secondary | ICD-10-CM

## 2016-02-19 ENCOUNTER — Encounter: Payer: Self-pay | Admitting: Internal Medicine

## 2016-04-01 ENCOUNTER — Other Ambulatory Visit: Payer: Self-pay | Admitting: Family Medicine

## 2016-04-01 ENCOUNTER — Ambulatory Visit
Admission: RE | Admit: 2016-04-01 | Discharge: 2016-04-01 | Disposition: A | Discharge status: Home / Self Care | Payer: 59 | Source: Ambulatory Visit | Attending: Family Medicine | Admitting: Family Medicine

## 2016-04-01 DIAGNOSIS — M47812 Spondylosis without myelopathy or radiculopathy, cervical region: Secondary | ICD-10-CM | POA: Diagnosis not present

## 2016-04-01 DIAGNOSIS — R200 Anesthesia of skin: Secondary | ICD-10-CM | POA: Diagnosis not present

## 2016-04-01 MED FILL — predniSONE 20 MG TABS: 20 | 9 days supply | Qty: 18 | Fill #0

## 2016-04-01 MED FILL — PREVIDENT 5000 SENSITIVE PA: 1.1-5 | 20 days supply | Qty: 100 | Fill #3

## 2016-04-16 DIAGNOSIS — D122 Benign neoplasm of ascending colon: Secondary | ICD-10-CM | POA: Diagnosis not present

## 2016-05-30 DIAGNOSIS — K635 Polyp of colon: Secondary | ICD-10-CM | POA: Diagnosis not present

## 2016-05-30 DIAGNOSIS — D122 Benign neoplasm of ascending colon: Secondary | ICD-10-CM | POA: Diagnosis not present

## 2016-05-30 DIAGNOSIS — D120 Benign neoplasm of cecum: Secondary | ICD-10-CM | POA: Diagnosis not present

## 2016-05-30 DIAGNOSIS — Z86010 Personal history of colonic polyps: Secondary | ICD-10-CM | POA: Diagnosis not present

## 2016-06-11 DIAGNOSIS — H2513 Age-related nuclear cataract, bilateral: Secondary | ICD-10-CM | POA: Diagnosis not present

## 2016-06-11 DIAGNOSIS — H524 Presbyopia: Secondary | ICD-10-CM | POA: Diagnosis not present

## 2016-06-11 DIAGNOSIS — H1713 Central corneal opacity, bilateral: Secondary | ICD-10-CM | POA: Diagnosis not present

## 2016-06-11 DIAGNOSIS — H11153 Pinguecula, bilateral: Secondary | ICD-10-CM | POA: Diagnosis not present

## 2016-06-11 DIAGNOSIS — H5213 Myopia, bilateral: Secondary | ICD-10-CM | POA: Diagnosis not present

## 2016-06-11 DIAGNOSIS — H52213 Irregular astigmatism, bilateral: Secondary | ICD-10-CM | POA: Diagnosis not present

## 2016-06-11 DIAGNOSIS — H43813 Vitreous degeneration, bilateral: Secondary | ICD-10-CM | POA: Diagnosis not present

## 2016-06-16 ENCOUNTER — Encounter: Payer: Self-pay | Admitting: Internal Medicine

## 2016-06-20 ENCOUNTER — Other Ambulatory Visit: Payer: Self-pay | Admitting: Family Medicine

## 2016-06-20 DIAGNOSIS — Z1231 Encounter for screening mammogram for malignant neoplasm of breast: Secondary | ICD-10-CM

## 2016-06-23 MED FILL — PREVIDENT 5000 SENSITIVE PA: 1.1-5 | 60 days supply | Qty: 300 | Fill #4

## 2016-07-22 ENCOUNTER — Ambulatory Visit
Admission: RE | Admit: 2016-07-22 | Discharge: 2016-07-22 | Disposition: A | Discharge status: Home / Self Care | Payer: 59 | Source: Ambulatory Visit | Attending: Family Medicine | Admitting: Family Medicine

## 2016-07-22 DIAGNOSIS — Z1231 Encounter for screening mammogram for malignant neoplasm of breast: Secondary | ICD-10-CM | POA: Diagnosis not present

## 2016-07-29 ENCOUNTER — Encounter: Payer: Self-pay | Admitting: Internal Medicine

## 2016-07-30 ENCOUNTER — Encounter: Payer: Self-pay | Admitting: Internal Medicine

## 2016-07-31 ENCOUNTER — Encounter: Payer: Self-pay | Admitting: Internal Medicine

## 2016-07-31 NOTE — Telephone Encounter (Signed)
Please let patient know that I completed a letter on her behalf to ask for an exception to her in network GI practice Let me know if she needs more Thanks

## 2016-08-27 MED FILL — PREVIDENT 5000 SENSITIVE PA: 1.1-5 | 30 days supply | Qty: 100 | Fill #0

## 2016-09-10 MED FILL — IBUPROFEN 600 MG TABLET: 600 | 4 days supply | Qty: 16 | Fill #0

## 2016-09-10 MED FILL — PREVIDENT 5000 SENSITIVE PA: 1.1-5 | 90 days supply | Qty: 300 | Fill #1

## 2016-09-10 MED FILL — AMOXICILLIN 250 MG CAPSULE: 250 | 10 days supply | Qty: 30 | Fill #0

## 2016-09-10 MED FILL — HYDROCODON-APAP 5-325: 5-325 | 3 days supply | Qty: 16 | Fill #0

## 2016-09-19 MED FILL — AMOXICILLIN 250 MG CAPSULE: 250 | 10 days supply | Qty: 30 | Fill #1

## 2016-09-23 DIAGNOSIS — N926 Irregular menstruation, unspecified: Secondary | ICD-10-CM | POA: Diagnosis not present

## 2016-09-23 DIAGNOSIS — Z0000 Encounter for general adult medical examination without abnormal findings: Secondary | ICD-10-CM | POA: Diagnosis not present

## 2016-09-23 DIAGNOSIS — Z1159 Encounter for screening for other viral diseases: Secondary | ICD-10-CM | POA: Diagnosis not present

## 2016-09-23 DIAGNOSIS — Z13220 Encounter for screening for lipoid disorders: Secondary | ICD-10-CM | POA: Diagnosis not present

## 2016-09-23 DIAGNOSIS — N390 Urinary tract infection, site not specified: Secondary | ICD-10-CM | POA: Diagnosis not present

## 2016-09-23 MED FILL — NITROFURANTOIN MONO-MCR 100: 100 | 15 days supply | Qty: 30 | Fill #0

## 2017-01-26 DIAGNOSIS — L918 Other hypertrophic disorders of the skin: Secondary | ICD-10-CM | POA: Diagnosis not present

## 2017-01-26 DIAGNOSIS — R208 Other disturbances of skin sensation: Secondary | ICD-10-CM | POA: Diagnosis not present

## 2017-01-26 DIAGNOSIS — L821 Other seborrheic keratosis: Secondary | ICD-10-CM | POA: Diagnosis not present

## 2017-01-26 DIAGNOSIS — L820 Inflamed seborrheic keratosis: Secondary | ICD-10-CM | POA: Diagnosis not present

## 2017-01-26 DIAGNOSIS — Z1283 Encounter for screening for malignant neoplasm of skin: Secondary | ICD-10-CM | POA: Diagnosis not present

## 2017-01-30 MED FILL — CLOBETASOL 0.05% SOLUTION: 0.05 | 30 days supply | Qty: 50 | Fill #0

## 2017-03-11 ENCOUNTER — Telehealth: Payer: Self-pay | Admitting: *Deleted

## 2017-03-11 NOTE — Telephone Encounter (Signed)
Patient has sent email stating:   "I received a call from [facility] since I was referred to them for the addition procedure last year and told I would need a annual Colonoscopy.  I am wondering if I need to continue to do my yearly colonoscopy with them in case another polyp is found at the valve where the large and small intestines meet?? Thoughts, reommendations? I believe that appointment was around 05/29/16. Would need a Monday appointment, since I get so sick with the prep process to avoid having to be out several day before and needs to be after the 12th of the month. Or would you recommend having the first annual done at [facility] and depending on those findings move my routine back to [place]?"  Please advise.Marland KitchenMarland Kitchen

## 2017-03-11 NOTE — Telephone Encounter (Signed)
Left voicemail for patient to call back. 

## 2017-03-11 NOTE — Telephone Encounter (Signed)
Would recommend the 1st surveillance to be done at [facility] If normal then we can transition back to procedures here

## 2017-03-12 NOTE — Telephone Encounter (Signed)
Patient has been advised of Dr Vena Rua recommendation. She states she will need Dr Hilarie Fredrickson to write another letter to her insurance company to let them know that it is necessary to have her procedure completed again at [facility] as they consider it out of network.

## 2017-03-19 ENCOUNTER — Encounter: Payer: Self-pay | Admitting: Internal Medicine

## 2017-03-20 NOTE — Telephone Encounter (Signed)
I amended the letter and it can now be sent

## 2017-03-20 NOTE — Telephone Encounter (Signed)
Letter has been created and sent to patients' home address so she may send to her insurance company. I have also sent a note to patient via mychart to advise her of this.

## 2017-06-05 DIAGNOSIS — Z86010 Personal history of colonic polyps: Secondary | ICD-10-CM | POA: Diagnosis not present

## 2017-06-05 DIAGNOSIS — D125 Benign neoplasm of sigmoid colon: Secondary | ICD-10-CM | POA: Diagnosis not present

## 2017-06-05 DIAGNOSIS — Z1211 Encounter for screening for malignant neoplasm of colon: Secondary | ICD-10-CM | POA: Diagnosis not present

## 2017-06-05 DIAGNOSIS — Z9889 Other specified postprocedural states: Secondary | ICD-10-CM | POA: Diagnosis not present

## 2017-06-05 DIAGNOSIS — K635 Polyp of colon: Secondary | ICD-10-CM | POA: Diagnosis not present

## 2017-06-17 ENCOUNTER — Other Ambulatory Visit: Payer: Self-pay | Admitting: Family Medicine

## 2017-06-17 DIAGNOSIS — Z1231 Encounter for screening mammogram for malignant neoplasm of breast: Secondary | ICD-10-CM

## 2017-07-08 DIAGNOSIS — H524 Presbyopia: Secondary | ICD-10-CM | POA: Diagnosis not present

## 2017-07-08 DIAGNOSIS — H52213 Irregular astigmatism, bilateral: Secondary | ICD-10-CM | POA: Diagnosis not present

## 2017-07-08 DIAGNOSIS — H5213 Myopia, bilateral: Secondary | ICD-10-CM | POA: Diagnosis not present

## 2017-07-27 ENCOUNTER — Ambulatory Visit: Payer: 59

## 2017-08-04 ENCOUNTER — Ambulatory Visit
Admission: RE | Admit: 2017-08-04 | Discharge: 2017-08-04 | Disposition: A | Discharge status: Home / Self Care | Payer: 59 | Source: Ambulatory Visit | Attending: Family Medicine | Admitting: Family Medicine

## 2017-08-04 DIAGNOSIS — Z1231 Encounter for screening mammogram for malignant neoplasm of breast: Secondary | ICD-10-CM | POA: Diagnosis not present

## 2017-08-05 ENCOUNTER — Other Ambulatory Visit: Payer: Self-pay | Admitting: Family Medicine

## 2017-08-05 DIAGNOSIS — R928 Other abnormal and inconclusive findings on diagnostic imaging of breast: Secondary | ICD-10-CM

## 2017-08-11 ENCOUNTER — Ambulatory Visit
Admission: RE | Admit: 2017-08-11 | Discharge: 2017-08-11 | Disposition: A | Discharge status: Home / Self Care | Payer: 59 | Source: Ambulatory Visit | Attending: Family Medicine | Admitting: Family Medicine

## 2017-08-11 ENCOUNTER — Other Ambulatory Visit: Payer: Self-pay | Admitting: Family Medicine

## 2017-08-11 DIAGNOSIS — R928 Other abnormal and inconclusive findings on diagnostic imaging of breast: Secondary | ICD-10-CM

## 2017-08-11 DIAGNOSIS — R921 Mammographic calcification found on diagnostic imaging of breast: Secondary | ICD-10-CM

## 2017-08-14 ENCOUNTER — Ambulatory Visit
Admission: RE | Admit: 2017-08-14 | Discharge: 2017-08-14 | Disposition: A | Discharge status: Home / Self Care | Payer: 59 | Source: Ambulatory Visit | Attending: Family Medicine | Admitting: Family Medicine

## 2017-08-14 ENCOUNTER — Other Ambulatory Visit: Payer: Self-pay | Admitting: Family Medicine

## 2017-08-14 DIAGNOSIS — N6012 Diffuse cystic mastopathy of left breast: Secondary | ICD-10-CM | POA: Diagnosis not present

## 2017-08-14 DIAGNOSIS — R921 Mammographic calcification found on diagnostic imaging of breast: Secondary | ICD-10-CM

## 2017-08-31 MED FILL — AMOXICILLIN 250 MG CAPSULE: 250 | 10 days supply | Qty: 30 | Fill #0

## 2017-09-01 MED FILL — IBUPROFEN 800 MG TAB: 800 | 4 days supply | Qty: 16 | Fill #0

## 2017-09-30 DIAGNOSIS — E669 Obesity, unspecified: Secondary | ICD-10-CM | POA: Diagnosis not present

## 2017-09-30 DIAGNOSIS — K580 Irritable bowel syndrome with diarrhea: Secondary | ICD-10-CM | POA: Diagnosis not present

## 2017-09-30 DIAGNOSIS — Z6831 Body mass index (BMI) 31.0-31.9, adult: Secondary | ICD-10-CM | POA: Diagnosis not present

## 2017-09-30 DIAGNOSIS — Z13220 Encounter for screening for lipoid disorders: Secondary | ICD-10-CM | POA: Diagnosis not present

## 2017-09-30 DIAGNOSIS — N390 Urinary tract infection, site not specified: Secondary | ICD-10-CM | POA: Diagnosis not present

## 2017-09-30 DIAGNOSIS — Z0000 Encounter for general adult medical examination without abnormal findings: Secondary | ICD-10-CM | POA: Diagnosis not present

## 2017-09-30 MED FILL — NITROFURANTOIN MONO-MCR 100: 100 | 15 days supply | Qty: 30 | Fill #0

## 2018-01-15 DIAGNOSIS — Z23 Encounter for immunization: Secondary | ICD-10-CM | POA: Diagnosis not present

## 2018-02-05 DIAGNOSIS — L603 Nail dystrophy: Secondary | ICD-10-CM | POA: Diagnosis not present

## 2018-02-05 DIAGNOSIS — R208 Other disturbances of skin sensation: Secondary | ICD-10-CM | POA: Diagnosis not present

## 2018-02-05 DIAGNOSIS — X32XXXA Exposure to sunlight, initial encounter: Secondary | ICD-10-CM | POA: Diagnosis not present

## 2018-02-05 DIAGNOSIS — L821 Other seborrheic keratosis: Secondary | ICD-10-CM | POA: Diagnosis not present

## 2018-02-05 DIAGNOSIS — L570 Actinic keratosis: Secondary | ICD-10-CM | POA: Diagnosis not present

## 2018-02-24 DIAGNOSIS — R208 Other disturbances of skin sensation: Secondary | ICD-10-CM | POA: Diagnosis not present

## 2018-02-24 DIAGNOSIS — X32XXXD Exposure to sunlight, subsequent encounter: Secondary | ICD-10-CM | POA: Diagnosis not present

## 2018-02-24 DIAGNOSIS — L570 Actinic keratosis: Secondary | ICD-10-CM | POA: Diagnosis not present

## 2018-02-24 MED FILL — TRIAMCINOLONE 0.1% CREAM: 0.1 | 20 days supply | Qty: 60 | Fill #0

## 2018-04-16 IMAGING — CR DG CERVICAL SPINE 2 OR 3 VIEWS
3 series · 3 of 3 positions shown · non-contrast
Comparison: 06/24/2006

CLINICAL DATA: Left arm numbness

EXAM:
CERVICAL SPINE - 2-3 VIEW

[w cervical spine lat]
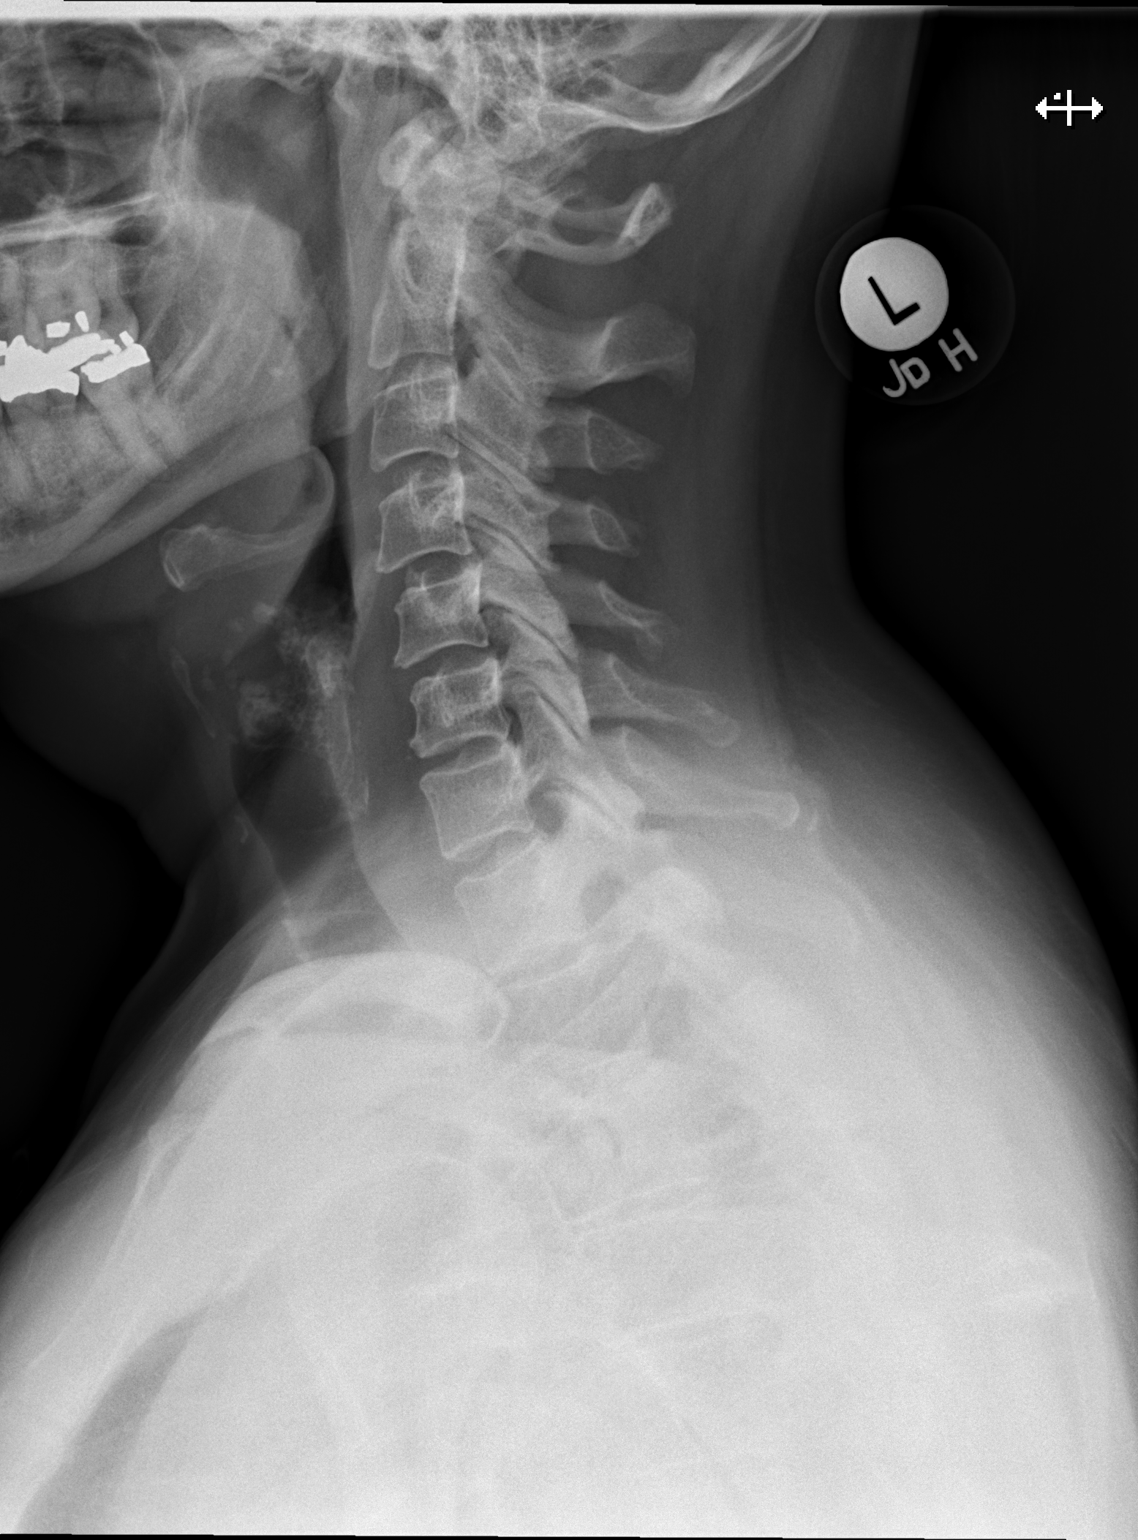

[w cervical spine ap]
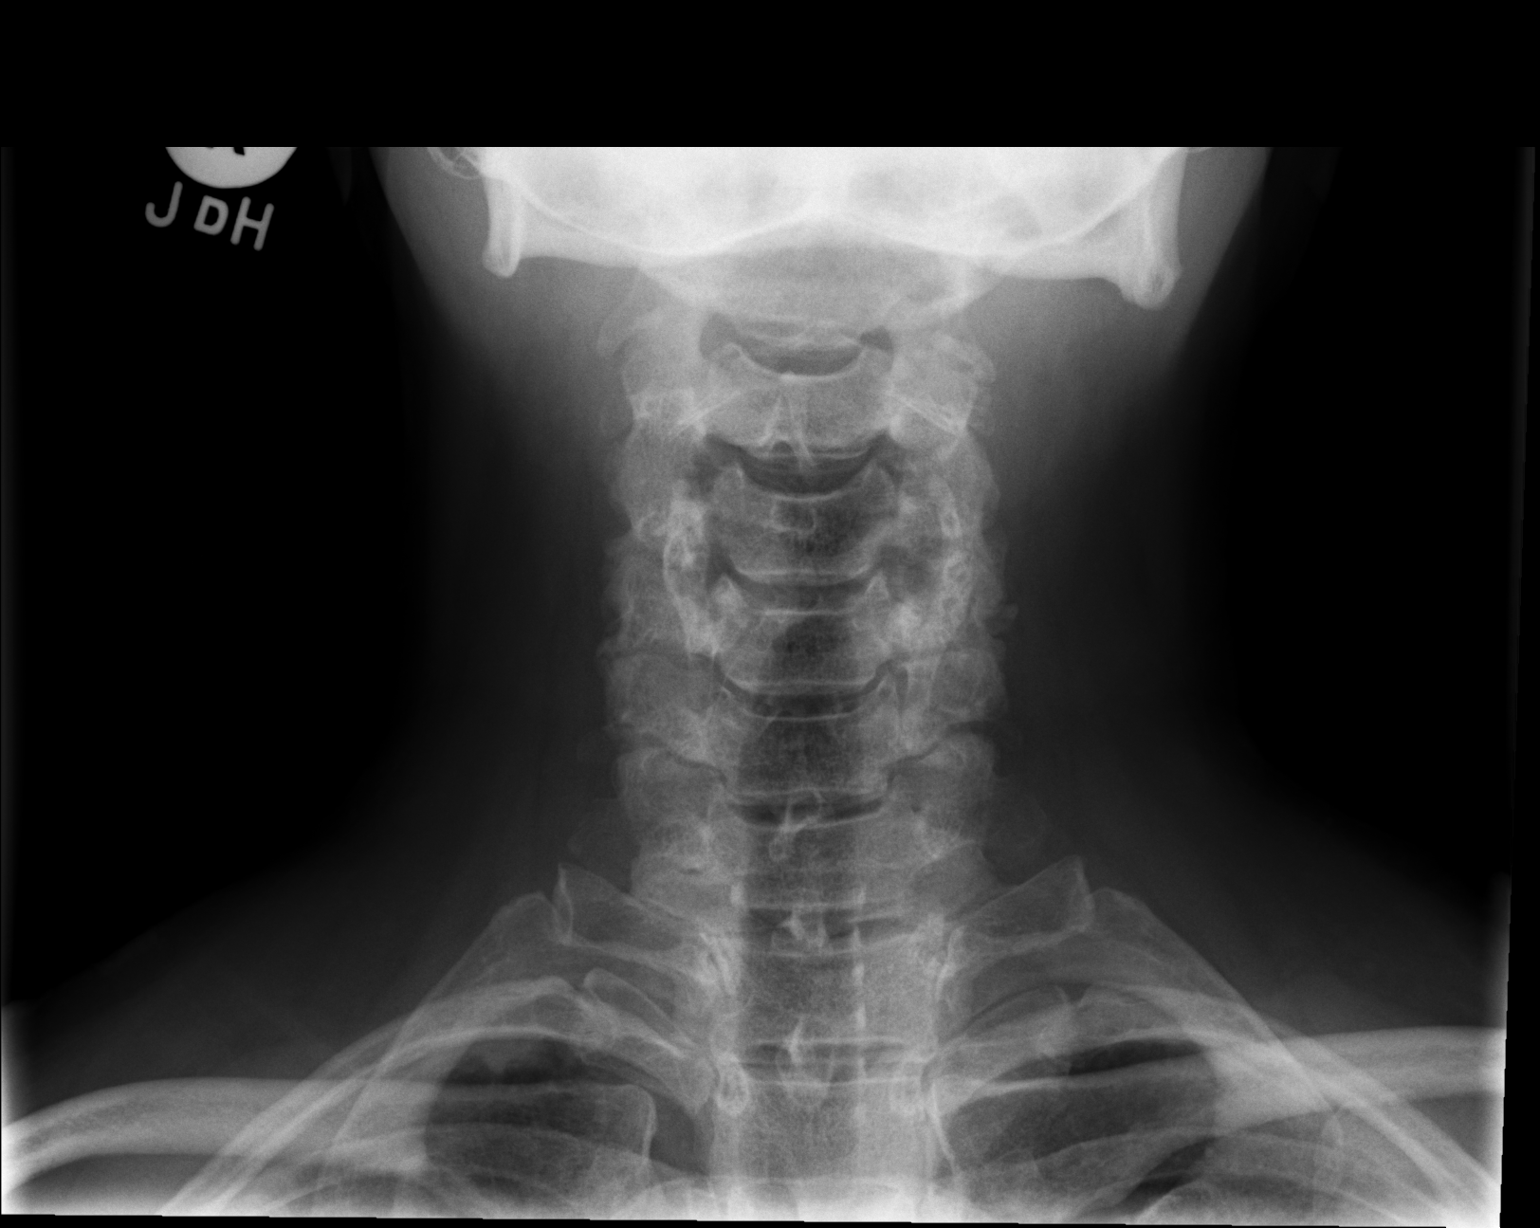

[t cervical spine odontoid]
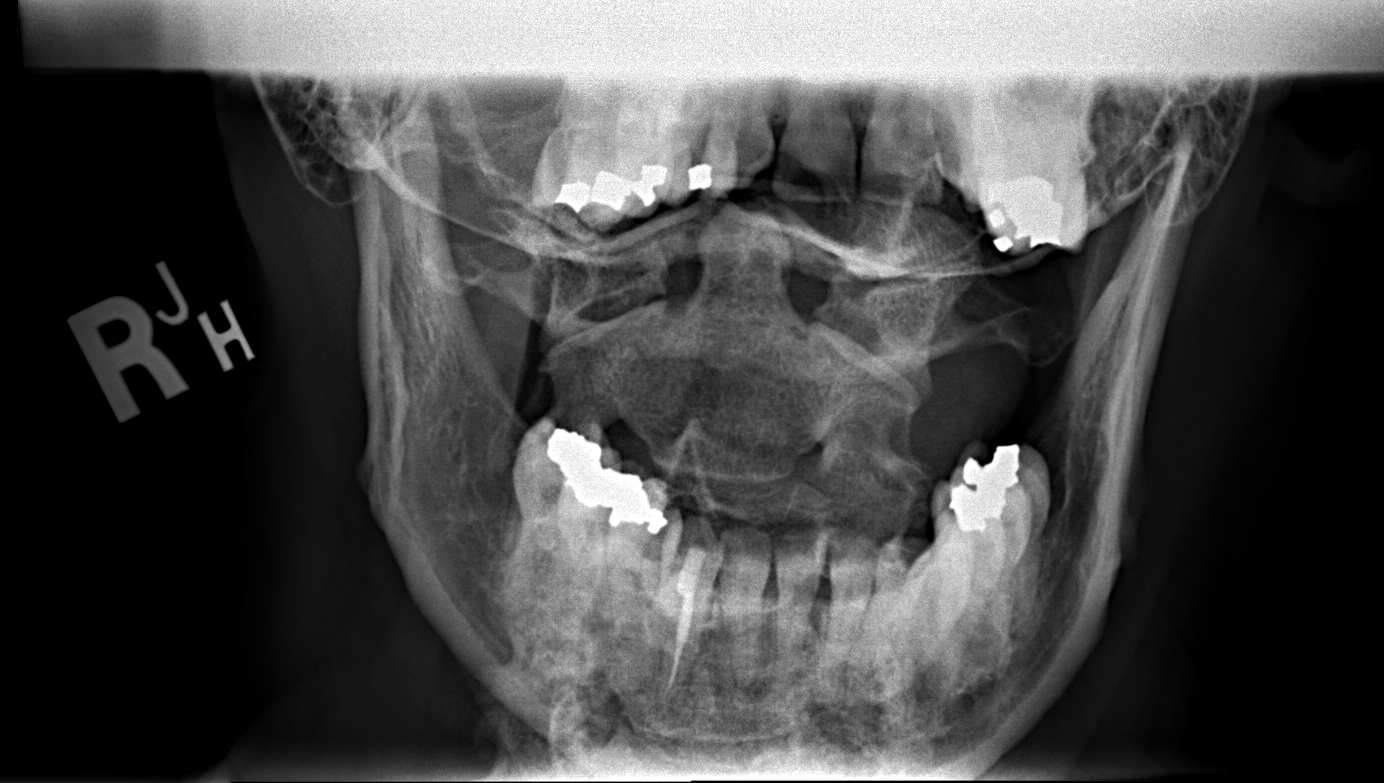

[3 of 3 positions shown; findings below may reference images not displayed]

FINDINGS: Mild anterior slip C4-5 and C5-6. No fracture identified. Mild disc
degeneration and mild endplate spurring at C4-5 and C6-7. Oblique
views not obtained to evaluate the foramina.
IMPRESSION: Mild anterior slip C4-5 and C5-6 likely degenerative. Mild
degenerative spurring in the cervical spine

## 2018-04-22 DIAGNOSIS — Z23 Encounter for immunization: Secondary | ICD-10-CM | POA: Diagnosis not present

## 2018-07-09 DIAGNOSIS — H52213 Irregular astigmatism, bilateral: Secondary | ICD-10-CM | POA: Diagnosis not present

## 2018-07-09 DIAGNOSIS — H524 Presbyopia: Secondary | ICD-10-CM | POA: Diagnosis not present

## 2018-07-09 DIAGNOSIS — H5213 Myopia, bilateral: Secondary | ICD-10-CM | POA: Diagnosis not present

## 2018-08-05 MED FILL — PREVIDENT 5000 SENSITIVE PA: 1.1-5 | 60 days supply | Qty: 200 | Fill #0 | Status: TO

## 2018-08-16 ENCOUNTER — Other Ambulatory Visit: Payer: Self-pay | Admitting: Family Medicine

## 2018-08-16 DIAGNOSIS — Z1231 Encounter for screening mammogram for malignant neoplasm of breast: Secondary | ICD-10-CM

## 2018-10-06 ENCOUNTER — Ambulatory Visit: Payer: 59

## 2018-10-19 MED FILL — AMOXICILLIN 500 MG CAPSULE: 500 | 7 days supply | Qty: 28 | Fill #0

## 2018-10-19 MED FILL — PREVIDENT 5000 SENSITIVE PA: 1.1-5 | 30 days supply | Qty: 100 | Fill #0

## 2018-11-16 ENCOUNTER — Ambulatory Visit
Admission: RE | Admit: 2018-11-16 | Discharge: 2018-11-16 | Disposition: A | Discharge status: Home / Self Care | Payer: 59 | Source: Ambulatory Visit | Attending: Family Medicine | Admitting: Family Medicine

## 2018-11-16 ENCOUNTER — Other Ambulatory Visit: Payer: Self-pay

## 2018-11-16 DIAGNOSIS — Z1231 Encounter for screening mammogram for malignant neoplasm of breast: Secondary | ICD-10-CM | POA: Diagnosis not present

## 2018-11-26 DIAGNOSIS — K580 Irritable bowel syndrome with diarrhea: Secondary | ICD-10-CM | POA: Diagnosis not present

## 2018-11-26 DIAGNOSIS — Z0000 Encounter for general adult medical examination without abnormal findings: Secondary | ICD-10-CM | POA: Diagnosis not present

## 2018-11-26 DIAGNOSIS — E669 Obesity, unspecified: Secondary | ICD-10-CM | POA: Diagnosis not present

## 2018-11-26 DIAGNOSIS — Z136 Encounter for screening for cardiovascular disorders: Secondary | ICD-10-CM | POA: Diagnosis not present

## 2018-11-26 DIAGNOSIS — N390 Urinary tract infection, site not specified: Secondary | ICD-10-CM | POA: Diagnosis not present

## 2018-11-26 DIAGNOSIS — M79673 Pain in unspecified foot: Secondary | ICD-10-CM | POA: Diagnosis not present

## 2018-11-26 DIAGNOSIS — R635 Abnormal weight gain: Secondary | ICD-10-CM | POA: Diagnosis not present

## 2018-11-26 MED FILL — NITROFURANTOIN MONO-MCR 100: 100 | 15 days supply | Qty: 30 | Fill #0

## 2018-11-26 MED FILL — MELOXICAM 15 MG TABLET: 15 | 30 days supply | Qty: 30 | Fill #0

## 2018-11-29 MED FILL — PREVIDENT 5000 SENSITIVE PA: 1.1-5 | 30 days supply | Qty: 100 | Fill #0

## 2019-02-15 DIAGNOSIS — T63461A Toxic effect of venom of wasps, accidental (unintentional), initial encounter: Secondary | ICD-10-CM | POA: Diagnosis not present

## 2019-05-18 DIAGNOSIS — D225 Melanocytic nevi of trunk: Secondary | ICD-10-CM | POA: Diagnosis not present

## 2019-05-18 DIAGNOSIS — Z1283 Encounter for screening for malignant neoplasm of skin: Secondary | ICD-10-CM | POA: Diagnosis not present

## 2019-05-18 DIAGNOSIS — L818 Other specified disorders of pigmentation: Secondary | ICD-10-CM | POA: Diagnosis not present

## 2019-05-18 DIAGNOSIS — L820 Inflamed seborrheic keratosis: Secondary | ICD-10-CM | POA: Diagnosis not present

## 2019-05-18 MED FILL — PREVIDENT 5000 SENSITIVE PA: 1.1-5 | 30 days supply | Qty: 100 | Fill #1

## 2019-05-18 MED FILL — CLOBETASOL 0.05% SOLUTION: 0.05 | 30 days supply | Qty: 50 | Fill #0

## 2019-07-27 DIAGNOSIS — H5213 Myopia, bilateral: Secondary | ICD-10-CM | POA: Diagnosis not present

## 2019-07-27 DIAGNOSIS — H524 Presbyopia: Secondary | ICD-10-CM | POA: Diagnosis not present

## 2019-07-27 DIAGNOSIS — H52213 Irregular astigmatism, bilateral: Secondary | ICD-10-CM | POA: Diagnosis not present

## 2019-10-04 ENCOUNTER — Other Ambulatory Visit: Payer: Self-pay | Admitting: Family Medicine

## 2019-10-04 DIAGNOSIS — Z1231 Encounter for screening mammogram for malignant neoplasm of breast: Secondary | ICD-10-CM

## 2019-11-18 ENCOUNTER — Other Ambulatory Visit: Payer: Self-pay

## 2019-11-18 ENCOUNTER — Ambulatory Visit
Admission: RE | Admit: 2019-11-18 | Discharge: 2019-11-18 | Disposition: A | Discharge status: Home / Self Care | Payer: 59 | Source: Ambulatory Visit | Attending: Family Medicine | Admitting: Family Medicine

## 2019-11-18 DIAGNOSIS — Z1231 Encounter for screening mammogram for malignant neoplasm of breast: Secondary | ICD-10-CM | POA: Diagnosis not present

## 2019-11-30 DIAGNOSIS — N390 Urinary tract infection, site not specified: Secondary | ICD-10-CM | POA: Diagnosis not present

## 2019-11-30 DIAGNOSIS — K580 Irritable bowel syndrome with diarrhea: Secondary | ICD-10-CM | POA: Diagnosis not present

## 2019-11-30 DIAGNOSIS — Z13220 Encounter for screening for lipoid disorders: Secondary | ICD-10-CM | POA: Diagnosis not present

## 2019-11-30 DIAGNOSIS — Z0000 Encounter for general adult medical examination without abnormal findings: Secondary | ICD-10-CM | POA: Diagnosis not present

## 2019-11-30 DIAGNOSIS — E669 Obesity, unspecified: Secondary | ICD-10-CM | POA: Diagnosis not present

## 2019-11-30 DIAGNOSIS — Z124 Encounter for screening for malignant neoplasm of cervix: Secondary | ICD-10-CM | POA: Diagnosis not present

## 2019-12-15 ENCOUNTER — Encounter: Payer: Self-pay | Admitting: Internal Medicine

## 2020-01-23 DIAGNOSIS — L820 Inflamed seborrheic keratosis: Secondary | ICD-10-CM | POA: Diagnosis not present

## 2020-01-23 MED FILL — TRIAMCINOLONE 0.1% CREAM: 0.1 | 14 days supply | Qty: 60 | Fill #0

## 2020-01-31 DIAGNOSIS — N950 Postmenopausal bleeding: Secondary | ICD-10-CM | POA: Diagnosis not present

## 2020-02-06 ENCOUNTER — Other Ambulatory Visit: Payer: Self-pay

## 2020-02-06 ENCOUNTER — Ambulatory Visit (AMBULATORY_SURGERY_CENTER): Payer: Self-pay | Admitting: *Deleted

## 2020-02-06 VITALS — Ht 68.0 in | Wt 214.0 lb

## 2020-02-06 DIAGNOSIS — Z86010 Personal history of colonic polyps: Secondary | ICD-10-CM

## 2020-02-06 NOTE — Progress Notes (Signed)
Patient is here in-person for PV. Patient denies any allergies to eggs or soy. Patient denies any problems with anesthesia/sedation. Patient denies any oxygen use at home. Patient denies taking any diet/weight loss medications or blood thinners. Patient is not being treated for MRSA or C-diff. Patient is aware of our care-partner policy and YIAXK-55 safety protocol.   COVID-19 vaccines completed. Patient states she can not do the suprep. Miralax + mag citrate given to pt.

## 2020-02-07 ENCOUNTER — Encounter: Payer: Self-pay | Admitting: Internal Medicine

## 2020-02-14 DIAGNOSIS — N950 Postmenopausal bleeding: Secondary | ICD-10-CM | POA: Diagnosis not present

## 2020-02-19 ENCOUNTER — Encounter: Payer: Self-pay | Admitting: Certified Registered Nurse Anesthetist

## 2020-02-20 ENCOUNTER — Ambulatory Visit (AMBULATORY_SURGERY_CENTER): Payer: 59 | Admitting: Internal Medicine

## 2020-02-20 ENCOUNTER — Encounter: Payer: Self-pay | Admitting: Internal Medicine

## 2020-02-20 ENCOUNTER — Other Ambulatory Visit: Payer: Self-pay

## 2020-02-20 VITALS — BP 140/79 | HR 71 | Temp 96.8°F | Resp 14 | Ht 68.0 in | Wt 214.0 lb

## 2020-02-20 DIAGNOSIS — D120 Benign neoplasm of cecum: Secondary | ICD-10-CM

## 2020-02-20 DIAGNOSIS — Z86010 Personal history of colonic polyps: Secondary | ICD-10-CM | POA: Diagnosis not present

## 2020-02-20 DIAGNOSIS — K589 Irritable bowel syndrome without diarrhea: Secondary | ICD-10-CM | POA: Diagnosis not present

## 2020-02-20 MED ORDER — SODIUM CHLORIDE 0.9 % IV SOLN: 500.0000 mL | Freq: Once | INTRAVENOUS | Status: DC

## 2020-02-20 NOTE — Patient Instructions (Signed)
   AWAIT BIOPSY RESULTS     YOU HAD AN ENDOSCOPIC PROCEDURE TODAY AT THE [facility]:   Refer to the procedure report that was given to you for any specific questions about what was found during the examination.  If the procedure report does not answer your questions, please call your gastroenterologist to clarify.  If you requested that your care partner not be given the details of your procedure findings, then the procedure report has been included in a sealed envelope for you to review at your convenience later.  YOU SHOULD EXPECT: Some feelings of bloating in the abdomen. Passage of more gas than usual.  Walking can help get rid of the air that was put into your GI tract during the procedure and reduce the bloating. If you had a lower endoscopy (such as a colonoscopy or flexible sigmoidoscopy) you may notice spotting of blood in your stool or on the toilet paper. If you underwent a bowel prep for your procedure, you may not have a normal bowel movement for a few days.  Please Note:  You might notice some irritation and congestion in your nose or some drainage.  This is from the oxygen used during your procedure.  There is no need for concern and it should clear up in a day or so.  SYMPTOMS TO REPORT IMMEDIATELY:  Following lower endoscopy (colonoscopy or flexible sigmoidoscopy):  Excessive amounts of blood in the stool  Significant tenderness or worsening of abdominal pains  Swelling of the abdomen that is new, acute  Fever of 100F or higher   For urgent or emergent issues, a gastroenterologist can be reached at any hour by calling [phone]. Do not use MyChart messaging for urgent concerns.    DIET:  We do recommend a small meal at first, but then you may proceed to your regular diet.  Drink plenty of fluids but you should avoid alcoholic beverages for 24 hours.  ACTIVITY:  You should plan to take it easy for the rest of today and you should NOT DRIVE or use heavy  machinery until tomorrow (because of the sedation medicines used during the test).    FOLLOW UP: Our staff will call the number listed on your records 48-72 hours following your procedure to check on you and address any questions or concerns that you may have regarding the information given to you following your procedure. If we do not reach you, we will leave a message.  We will attempt to reach you two times.  During this call, we will ask if you have developed any symptoms of COVID 19. If you develop any symptoms (ie: fever, flu-like symptoms, shortness of breath, cough etc.) before then, please call [phone].  If you test positive for Covid 19 in the 2 weeks post procedure, please call and report this information to us.    If any biopsies were taken you will be contacted by phone or by letter within the next 1-3 weeks.  Please call us at [phone] if you have not heard about the biopsies in 3 weeks.    SIGNATURES/CONFIDENTIALITY: You and/or your care partner have signed paperwork which will be entered into your electronic medical record.  These signatures attest to the fact that that the information above on your After Visit Summary has been reviewed and is understood.  Full responsibility of the confidentiality of this discharge information lies with you and/or your care-partner.  

## 2020-02-20 NOTE — Op Note (Signed)
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                            Arsenio Loader), history of sessile serrated polyp, Last                            colonoscopy: December 2018 with Dr. Arsenio Loader with no                            evidence of residual polyp at ICV. Medicines:                Monitored Anesthesia Care Procedure:                Pre-Anesthesia Assessment:                           - Prior to the procedure, a History and Physical                            was performed, and patient medications and                            allergies were reviewed. The patient's tolerance of                            previous anesthesia was also reviewed. The risks                            and benefits of the procedure and the sedation                            options and risks were discussed with the patient.                            All questions were answered, and informed consent                            was obtained. Prior Anticoagulants: The patient has                            taken no previous anticoagulant or antiplatelet                            agents. ASA Grade Assessment: I - A normal, healthy  patient. After reviewing the risks and benefits,                            the patient was deemed in satisfactory condition to                            undergo the procedure.                           After obtaining informed consent, the colonoscope                            was passed under direct vision. Throughout the                            procedure, the patient's blood pressure, pulse,  and                            oxygen saturations were monitored continuously. The                            Colonoscope was introduced through the anus and                            advanced to the terminal ileum. The colonoscopy was                            performed without difficulty. The patient tolerated                            the procedure well. The quality of the bowel                            preparation was good with 2 day Mg Citrate +                            MiraLax. The terminal ileum, ileocecal valve,                            appendiceal orifice, and rectum were photographed. Scope In: 9:54:25 AM Scope Out: 10:12:17 AM Scope Withdrawal Time: 0 hours 12 minutes 56 seconds  Total Procedure Duration: 0 hours 17 minutes 52 seconds  Findings:                 The digital rectal exam was normal.                           A 15 mm polypoid lesion was found at the ileocecal                            valve. The lesion was flat. Biopsies were taken                            with a cold forceps from the  edge of the                            abnormal-appearing tissue for histology.                           The exam was otherwise without abnormality on                            direct and retroflexion views. Complications:            No immediate complications. Estimated Blood Loss:     Estimated blood loss was minimal. 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 Biopsied.                           - The examination was otherwise normal on direct                            and retroflexion views. Recommendation:           - Patient has a contact number available for                            emergencies. The signs and symptoms of potential                            delayed complications were discussed with the                            patient. Return to normal  activities tomorrow.                            Written discharge instructions were provided to the                            patient.                           - Resume previous diet.                           - Continue present medications.                           - Await pathology results.                           - If biopsy confirms recurrent adenoma then will                            need to consider repeat attempt at EMR versus  ileocecectomy.                           - Repeat colonoscopy is recommended. The                            colonoscopy date will be determined after pathology                            results from today's exam become available for                            review. Jerene Bears, MD 02/20/2020 10:21:56 AM This report has been signed electronically.

## 2020-02-20 NOTE — Progress Notes (Signed)
Pt's states no medical or surgical changes since previsit or office visit.  SB - vitals 

## 2020-02-20 NOTE — Progress Notes (Signed)
Report given to PACU, vss 

## 2020-02-20 NOTE — Progress Notes (Signed)
Called to room to assist during endoscopic procedure.  Patient ID and intended procedure confirmed with present staff. Received instructions for my participation in the procedure from the performing physician.  

## 2020-02-22 ENCOUNTER — Telehealth: Payer: Self-pay

## 2020-02-22 NOTE — Telephone Encounter (Signed)
  Follow up Call-  Call back number 02/20/2020  Post procedure Call Back phone  # [phone]  Permission to leave phone message Yes  Some recent data might be hidden     Patient questions:  Do you have a fever, pain , or abdominal swelling? No. Pain Score  0 *  Have you tolerated food without any problems? Yes.    Have you been able to return to your normal activities? Yes.    Do you have any questions about your discharge instructions: Diet   No. Medications  No. Follow up visit  No.  Do you have questions or concerns about your Care? No.  Actions: * If pain score is 4 or above: No action needed, pain <4. 1. Have you developed a fever since your procedure? no  2.   Have you had an respiratory symptoms (SOB or cough) since your procedure? no  3.   Have you tested positive for COVID 19 since your procedure no  4.   Have you had any family members/close contacts diagnosed with the COVID 19 since your procedure?  no   If yes to any of these questions please route to Joylene John, RN and Joella Prince, RN

## 2020-03-01 ENCOUNTER — Telehealth: Payer: Self-pay

## 2020-03-01 ENCOUNTER — Other Ambulatory Visit: Payer: Self-pay

## 2020-03-01 DIAGNOSIS — D120 Benign neoplasm of cecum: Secondary | ICD-10-CM

## 2020-03-01 NOTE — Telephone Encounter (Signed)
Emily Mejia can you answer this for her.  Thank you

## 2020-03-01 NOTE — Telephone Encounter (Signed)
The pt has been scheduled for 04/23/20 at [facility] at 830 am.  Information sent to the pt via My Chart per pt previous communication.  She has been advised to call back with any further questions.

## 2020-03-01 NOTE — Telephone Encounter (Signed)
-----   Message from Dow Adolph sent at 03/01/2020 12:11 PM EDT ----- Chong Sicilian this patient wants to schedule for her Colon EMR in November.

## 2020-03-02 NOTE — Telephone Encounter (Signed)
Can you please answer this question for her?  Thank you

## 2020-03-02 NOTE — Telephone Encounter (Signed)
Patty,  We do not have a price list for this procedure that Mrs. Killmer is having.  She will need to contact the billing office directly @ [phone].  Thanks

## 2020-04-19 ENCOUNTER — Other Ambulatory Visit (HOSPITAL_COMMUNITY)
Admission: RE | Admit: 2020-04-19 | Discharge: 2020-04-19 | Disposition: A | Discharge status: Home / Self Care | Payer: 59 | Source: Ambulatory Visit | Attending: Gastroenterology | Admitting: Gastroenterology

## 2020-04-19 DIAGNOSIS — Z01812 Encounter for preprocedural laboratory examination: Secondary | ICD-10-CM | POA: Diagnosis not present

## 2020-04-19 DIAGNOSIS — Z20822 Contact with and (suspected) exposure to covid-19: Secondary | ICD-10-CM | POA: Insufficient documentation

## 2020-04-19 LAB — SARS CORONAVIRUS 2 (TAT 6-24 HRS): SARS Coronavirus 2: NEGATIVE

## 2020-04-23 ENCOUNTER — Encounter (HOSPITAL_COMMUNITY)
Admission: RE | Disposition: A | Discharge status: Home / Self Care | Payer: Self-pay | Source: Home / Self Care | Attending: Gastroenterology

## 2020-04-23 ENCOUNTER — Ambulatory Visit (HOSPITAL_COMMUNITY): Payer: 59 | Admitting: Certified Registered Nurse Anesthetist

## 2020-04-23 ENCOUNTER — Other Ambulatory Visit: Payer: Self-pay

## 2020-04-23 ENCOUNTER — Encounter (HOSPITAL_COMMUNITY): Payer: Self-pay | Admitting: Gastroenterology

## 2020-04-23 ENCOUNTER — Ambulatory Visit (HOSPITAL_COMMUNITY)
Admission: RE | Admit: 2020-04-23 | Discharge: 2020-04-23 | Disposition: A | Discharge status: Home / Self Care | Payer: 59 | Attending: Gastroenterology | Admitting: Gastroenterology

## 2020-04-23 DIAGNOSIS — Z09 Encounter for follow-up examination after completed treatment for conditions other than malignant neoplasm: Secondary | ICD-10-CM | POA: Diagnosis not present

## 2020-04-23 DIAGNOSIS — Z8379 Family history of other diseases of the digestive system: Secondary | ICD-10-CM | POA: Insufficient documentation

## 2020-04-23 DIAGNOSIS — K5730 Diverticulosis of large intestine without perforation or abscess without bleeding: Secondary | ICD-10-CM | POA: Diagnosis not present

## 2020-04-23 DIAGNOSIS — D1339 Benign neoplasm of other parts of small intestine: Secondary | ICD-10-CM | POA: Diagnosis not present

## 2020-04-23 DIAGNOSIS — K641 Second degree hemorrhoids: Secondary | ICD-10-CM | POA: Insufficient documentation

## 2020-04-23 DIAGNOSIS — K635 Polyp of colon: Secondary | ICD-10-CM

## 2020-04-23 DIAGNOSIS — Z8371 Family history of colonic polyps: Secondary | ICD-10-CM | POA: Diagnosis not present

## 2020-04-23 DIAGNOSIS — D120 Benign neoplasm of cecum: Secondary | ICD-10-CM | POA: Diagnosis not present

## 2020-04-23 HISTORY — PX: POLYPECTOMY: SHX5525

## 2020-04-23 HISTORY — PX: ENDOSCOPIC MUCOSAL RESECTION: SHX6839

## 2020-04-23 HISTORY — PX: COLONOSCOPY WITH PROPOFOL: SHX5780

## 2020-04-23 HISTORY — PX: HEMOSTASIS CLIP PLACEMENT: SHX6857

## 2020-04-23 HISTORY — PX: SUBMUCOSAL LIFTING INJECTION: SHX6855

## 2020-04-23 SURGERY — COLONOSCOPY WITH PROPOFOL
Anesthesia: Monitor Anesthesia Care

## 2020-04-23 MED ORDER — PROPOFOL 500 MG/50ML IV EMUL
INTRAVENOUS | Status: DC | PRN
  Administered 2020-04-23: 100 ug/kg/min via INTRAVENOUS

## 2020-04-23 MED ORDER — LACTATED RINGERS IV SOLN
INTRAVENOUS | Status: DC
  Administered 2020-04-23: 1000 mL via INTRAVENOUS

## 2020-04-23 MED ORDER — PHENYLEPHRINE 40 MCG/ML (10ML) SYRINGE FOR IV PUSH (FOR BLOOD PRESSURE SUPPORT)
PREFILLED_SYRINGE | INTRAVENOUS | Status: DC | PRN
  Administered 2020-04-23 (×5): 80 ug via INTRAVENOUS

## 2020-04-23 MED ORDER — SODIUM CHLORIDE 0.9 % IV SOLN: INTRAVENOUS | Status: DC

## 2020-04-23 MED ORDER — PROPOFOL 500 MG/50ML IV EMUL
INTRAVENOUS | Status: AC
  Filled 2020-04-23: qty 50

## 2020-04-23 MED ORDER — LIDOCAINE 2% (20 MG/ML) 5 ML SYRINGE
INTRAMUSCULAR | Status: DC | PRN
  Administered 2020-04-23: 100 mg via INTRAVENOUS

## 2020-04-23 MED ORDER — PROPOFOL 10 MG/ML IV BOLUS
INTRAVENOUS | Status: AC
  Filled 2020-04-23: qty 20

## 2020-04-23 MED ORDER — PROPOFOL 10 MG/ML IV BOLUS
INTRAVENOUS | Status: DC | PRN
  Administered 2020-04-23 (×2): 20 mg via INTRAVENOUS

## 2020-04-23 SURGICAL SUPPLY — 21 items

## 2020-04-23 NOTE — Op Note (Signed)
[facility] Patient Name: Emily Mejia Procedure Date: 04/23/2020 MRN: [id number] Attending MD: Gabriel Mansouraty , MD Date of Birth: 05/15/1961 CSN: [id number] Age: 59 Admit Type: Outpatient Procedure:                Colonoscopy Indications:              Excision of colonic polyp (previous 2017 resection                            via EMR with negative Colonoscopy in 2018 and                            returned in 2021) Providers:                Gabriel Mansouraty, MD, Carrie Baker, RN, Jarmila                            Fucs RN, RN, Shayla Proctor, Technician Referring MD:             Jay M. Pyrtle, MD, Anna G. Becker Medicines:                Monitored Anesthesia Care Complications:            No immediate complications. Estimated Blood Loss:     Estimated blood loss was minimal. Procedure:                Pre-Anesthesia Assessment:                           - Prior to the procedure, a History and Physical                            was performed, and patient medications and                            allergies were reviewed. The patient's tolerance of                            previous anesthesia was also reviewed. The risks                            and benefits of the procedure and the sedation                            options and risks were discussed with the patient.                            All questions were answered, and informed consent                            was obtained. Prior Anticoagulants: The patient has                            taken no previous anticoagulant or antiplatelet                              agents except for aspirin. ASA Grade Assessment: II                            - A patient with mild systemic disease. After                            reviewing the risks and benefits, the patient was                            deemed in satisfactory condition to undergo the                            procedure.                            After obtaining informed consent, the colonoscope                            was passed under direct vision. Throughout the                            procedure, the patient's blood pressure, pulse, and                            oxygen saturations were monitored continuously. The                            CF-HQ190L ([id number]) Olympus colonoscope was                            introduced through the anus and advanced to the 5                            cm into the ileum. The colonoscopy was performed                            without difficulty. The patient tolerated the                            procedure. The quality of the bowel preparation was                            adequate. The terminal ileum, ileocecal valve,                            appendiceal orifice, and rectum were photographed. Scope In: 9:15:38 AM Scope Out: 10:20:22 AM Scope Withdrawal Time: 0 hours 59 minutes 34 seconds  Total Procedure Duration: 1 hour 4 minutes 44 seconds  Findings:      The digital rectal exam findings include hemorrhoids. Pertinent       negatives include no palpable rectal lesions.      The terminal ileum and ileocecal valve appeared normal.      The ileocecal valve (on ascending colon side) had one sessile, granular,         non-bleeding polyp. The polyp was nearly 20 mm in diameter. Preparations       were made for mucosal resection. NBI imaging and White-light endoscopy       was done to demarcate the borders of the lesion. Orise gel was injected       to raise the lesion but did not offer a complete/adequate lift (I       suspect from the previous EMR resection years ago and fibrosis). I       filled the cecum and ascending colon with water in order to aid in lift       resection of the polyp which did help partially. Piecemeal cold mucosal       resection using a snare was performed with some Avulsion performed as       well. Resection and retrieval were complete. At the end of the  resection       the entire colonic side of the ICV as well as 1/3 of the first ascending       fold had been resected to ensure an adequate margin. To prevent bleeding       after mucosal resection, three hemostatic clips were successfully placed       (MR conditional) to close the region resected in higher risk areas (the       entire area was not closed however due to size of defect). There was no       bleeding at the end of the procedure.      A 3 mm polyp was found in the ascending colon. The polyp was sessile.       The polyp was removed with a cold snare. Resection and retrieval were       complete.      Multiple small-mouthed diverticula were found in the recto-sigmoid colon       and sigmoid colon.      Normal mucosa was found in the entire colon otherwise.      Non-bleeding non-thrombosed internal hemorrhoids were found during       retroflexion, during perianal exam and during digital exam. The       hemorrhoids were Grade II (internal hemorrhoids that prolapse but reduce       spontaneously). Impression:               - Hemorrhoids found on digital rectal exam.                           - The examined portion of the ileum was normal.                           - One polyp on the ascending-colonic side of the                            ileocecal valve was found and removed with                            piecemeal mucosal resection. Resected and                            retrieved. Clips (MR conditional) were placed.                           -   One 3 mm polyp in the ascending colon, removed                            with a cold snare. Resected and retrieved.                           - Diverticulosis in the recto-sigmoid colon and in                            the sigmoid colon.                           - Normal mucosa in the entire examined colon                            otherwise.                           - Non-bleeding non-thrombosed internal hemorrhoids. Moderate  Sedation:      Not Applicable - Patient had care per Anesthesia. Recommendation:           - The patient will be observed post-procedure,                            until all discharge criteria are met.                           - Discharge patient to home.                           - Patient has a contact number available for                            emergencies. The signs and symptoms of potential                            delayed complications were discussed with the                            patient. Return to normal activities tomorrow.                            Written discharge instructions were provided to the                            patient.                           - High fiber diet.                           - Continue present medications.                           - Await pathology results.                           -   Repeat colonoscopy in 6-9 months for surveillance                            after piecemeal resection. If things recur then                            will use Endorotor technique at that time. I feel                            OVESCO FTRD may not be best in this particular case                            as a result of the true ICV and ileum being right                            behind this region.                           - The findings and recommendations were discussed                            with the patient.                           - The findings and recommendations were discussed                            with the designated responsible adult. Procedure Code(s):        --- Professional ---                           [phone], Colonoscopy, flexible; with endoscopic                            mucosal resection                           [id number], 78, Colonoscopy, flexible; with removal of                            tumor(s), polyp(s), or other lesion(s) by snare                            technique Diagnosis Code(s):        --- Professional ---                            K64.1, Second degree hemorrhoids                           D13.39, Benign neoplasm of other parts of small                            intestine  K63.5, Polyp of colon                           K57.30, Diverticulosis of large intestine without                            perforation or abscess without bleeding CPT copyright 2019 American Medical Association. All rights reserved. The codes documented in this report are preliminary and upon coder review may  be revised to meet current compliance requirements. Justice Britain, MD 04/23/2020 10:40:09 AM Number of Addenda: 0

## 2020-04-23 NOTE — Anesthesia Postprocedure Evaluation (Signed)
Anesthesia Post Note  Patient: Emily Mejia  Procedure(s) Performed: COLONOSCOPY WITH PROPOFOL (N/A ) ENDOSCOPIC MUCOSAL RESECTION (N/A ) SUBMUCOSAL LIFTING INJECTION HEMOSTASIS CLIP PLACEMENT     Patient location during evaluation: Endoscopy Anesthesia Type: MAC Level of consciousness: awake and alert Pain management: pain level controlled Vital Signs Assessment: post-procedure vital signs reviewed and stable Respiratory status: spontaneous breathing, nonlabored ventilation, respiratory function stable and patient connected to nasal cannula oxygen Cardiovascular status: blood pressure returned to baseline and stable Postop Assessment: no apparent nausea or vomiting Anesthetic complications: no   No complications documented.  Last Vitals:  Vitals:   04/23/20 1030 04/23/20 1040  BP: 106/68 126/74  Pulse: 71 70  Resp: 13 15  Temp: 36.5 C   SpO2: 100% 99%    Last Pain:  Vitals:   04/23/20 1040  TempSrc:   PainSc: 0-No pain                 [name] [name]

## 2020-04-23 NOTE — Anesthesia Preprocedure Evaluation (Addendum)
Anesthesia Evaluation  Patient identified by MRN, date of birth, ID band Patient awake    Reviewed: Allergy & Precautions, NPO status , Patient's Chart, lab work & pertinent test results  Airway Mallampati: III  TM Distance: >3 FB Neck ROM: Full    Dental no notable dental hx. (+) Teeth Intact, Dental Advisory Given   Pulmonary neg pulmonary ROS,    Pulmonary exam normal breath sounds clear to auscultation       Cardiovascular negative cardio ROS Normal cardiovascular exam Rhythm:Regular Rate:Normal     Neuro/Psych negative neurological ROS  negative psych ROS   GI/Hepatic negative GI ROS, Neg liver ROS,   Endo/Other  negative endocrine ROS  Renal/GU negative Renal ROS  negative genitourinary   Musculoskeletal negative musculoskeletal ROS (+)   Abdominal   Peds  Hematology negative hematology ROS (+)   Anesthesia Other Findings Recurrent ICV polyp   Reproductive/Obstetrics                          Anesthesia Physical Anesthesia Plan  ASA: II  Anesthesia Plan: MAC   Post-op Pain Management:    Induction: Intravenous  PONV Risk Score and Plan: Propofol infusion and Treatment may vary due to age or medical condition  Airway Management Planned: Natural Airway  Additional Equipment:   Intra-op Plan:   Post-operative Plan:   Informed Consent: I have reviewed the patients History and Physical, chart, labs and discussed the procedure including the risks, benefits and alternatives for the proposed anesthesia with the patient or authorized representative who has indicated his/her understanding and acceptance.     Dental advisory given  Plan Discussed with: CRNA  Anesthesia Plan Comments:         Anesthesia Quick Evaluation

## 2020-04-23 NOTE — Discharge Instructions (Signed)

## 2020-04-23 NOTE — H&P (Signed)
GASTROENTEROLOGY PROCEDURE H&P NOTE   Primary Care Physician: Lois Huxley, PA  HPI: Emily Mejia is a 59 y.o. female who presents for Colonoscopy for attempt at EMR of recurrent ICV polyp (previous EMR years ago).  Past Medical History:  Diagnosis Date  . IBS (irritable bowel syndrome)   . Ovarian cyst   . Toenail fungus    Past Surgical History:  Procedure Laterality Date  . COLONOSCOPY  01/21/2016  . OVARIAN CYST SURGERY    . POLYPECTOMY     Current Facility-Administered Medications  Medication Dose Route Frequency Provider Last Rate Last Admin  . 0.9 %  sodium chloride infusion   Intravenous Continuous Mansouraty, Telford Nab., MD      . lactated ringers infusion   Intravenous Continuous Mansouraty, Telford Nab., MD       No Known Allergies Family History  Problem Relation Age of Onset  . Gallbladder disease Mother        Gallbladder Cancer  . Irritable bowel syndrome Mother   . Diabetes Mother   . Cancer Mother        GB, liver mets  . Hyperlipidemia Mother   . Hypertension Mother   . Diabetes Father   . Hypertension Father   . Hyperlipidemia Father   . Colon polyps Father   . Colon polyps Sister   . Cancer Brother 68       lymphoma  . Colon polyps Brother   . Colon polyps Sister   . Coronary artery disease Other   . Aneurysm Sister   . Aneurysm Paternal Uncle   . Colon cancer Neg Hx   . Esophageal cancer Neg Hx   . Rectal cancer Neg Hx   . Stomach cancer Neg Hx    Social History   Socioeconomic History  . Marital status: Divorced    Spouse name: Not on file  . Number of children: Not on file  . Years of education: Not on file  . Highest education level: Not on file  Occupational History  . 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 Smoking status: Never Smoker  . Smokeless tobacco: Never Used  Vaping Use  . Vaping Use: Never used  Substance and Sexual Activity  . Alcohol use: No  . Drug  use: No  . Sexual activity: Yes    Partners: Male  Other Topics Concern  . Not on file  Social History Narrative   Exercise--- walks --  [employer] [place]   Social Determinants of Health   Financial Resource Strain:   . Difficulty of Paying Living Expenses: Not on file  Food Insecurity:   . Worried About Charity fundraiser in the Last Year: Not on file  . Ran Out of Food in the Last Year: Not on file  Transportation Needs:   . Lack of Transportation (Medical): Not on file  . Lack of Transportation (Non-Medical): Not on file  Physical Activity:   . Days of Exercise per Week: Not on file  . Minutes of Exercise per Session: Not on file  Stress:   . Feeling of Stress : Not on file  Social Connections:   . Frequency of Communication with Friends and Family: Not on file  . Frequency of Social Gatherings with Friends and Family: Not on file  . Attends Religious Services: Not on file  . Active Member of Clubs or Organizations: Not on file  .  Attends Archivist Meetings: Not on file  . Marital Status: Not on file  Intimate Partner Violence:   . Fear of Current or Ex-Partner: Not on file  . Emotionally Abused: Not on file  . Physically Abused: Not on file  . Sexually Abused: Not on file    Physical Exam: Vital signs in last 24 hours:     GEN: NAD EYE: Sclerae anicteric ENT: MMM CV: Non-tachycardic GI: Soft, NT/ND NEURO:  Alert & Oriented x 3  Lab Results: No results for input(s): WBC, HGB, HCT, PLT in the last 72 hours. BMET No results for input(s): NA, K, CL, CO2, GLUCOSE, BUN, CREATININE, CALCIUM in the last 72 hours. LFT No results for input(s): PROT, ALBUMIN, AST, ALT, ALKPHOS, BILITOT, BILIDIR, IBILI in the last 72 hours. PT/INR No results for input(s): LABPROT, INR in the last 72 hours.   Impression / Plan: This is a 59 y.o.female who presents for Colonoscopy for attempt at EMR of recurrent ICV polyp (previous EMR years ago).  Based upon the  description and endoscopic pictures I do feel that it is reasonable to pursue an Advanced Polypectomy attempt of the polyp/lesion.  We discussed some of the techniques of advanced polypectomy which include Endoscopic Mucosal Resection, OVESCO Full-Thickness Resection, Endorotor Morcellation, and Tissue Ablation via Fulguration.  The risks and benefits of endoscopic evaluation were discussed with the patient; these include but are not limited to the risk of perforation, infection, bleeding, missed lesions, lack of diagnosis, severe illness requiring hospitalization, as well as anesthesia and sedation related illnesses.  During attempts at advanced resection, the risks of bleeding and perforation/leak are increased as opposed to diagnostic and screening procedures, and that was discussed with the patient as well.   In addition, I explained that with the possible need for piecemeal resection, subsequent short-interval endoscopic evaluation for follow up and potential retreatment of the lesion/area may be necessary.  If, after attempt at removal of the polyp/lesion, it is found that the patient has a complication or that an invasive lesion or malignant lesion is found, or that the polyp/lesion continues to recur, the patient is aware and understands that surgery may still be indicated/required.  All patient questions were answered, to the best of my ability, and the patient agrees to the aforementioned plan of action with follow-up as indicated.   The risks and benefits of endoscopic evaluation were discussed with the patient; these include but are not limited to the risk of perforation, infection, bleeding, missed lesions, lack of diagnosis, severe illness requiring hospitalization, as well as anesthesia and sedation related illnesses.  The patient is agreeable to proceed.    Justice Britain, MD [facility] Advanced Endoscopy Office # [phone]

## 2020-04-23 NOTE — Transfer of Care (Signed)
Immediate Anesthesia Transfer of Care Note  Patient: Emily Mejia  Procedure(s) Performed: COLONOSCOPY WITH PROPOFOL (N/A ) ENDOSCOPIC MUCOSAL RESECTION (N/A ) SUBMUCOSAL LIFTING INJECTION HEMOSTASIS CLIP PLACEMENT  Patient Location: Endoscopy Unit  Anesthesia Type:MAC  Level of Consciousness: drowsy and patient cooperative  Airway & Oxygen Therapy: Patient Spontanous Breathing and Patient connected to face mask oxygen  Post-op Assessment: Report given to RN and Post -op Vital signs reviewed and stable  Post vital signs: Reviewed and stable  Last Vitals:  Vitals Value Taken Time  BP    Temp    Pulse 69 04/23/20 1028  Resp 10 04/23/20 1028  SpO2 100 % 04/23/20 1028  Vitals shown include unvalidated device data.  Last Pain:  Vitals:   04/23/20 0810  TempSrc: Oral         Complications: No complications documented.

## 2020-04-24 LAB — SURGICAL PATHOLOGY

## 2020-04-25 ENCOUNTER — Encounter: Payer: Self-pay | Admitting: Gastroenterology

## 2020-04-25 ENCOUNTER — Encounter (HOSPITAL_COMMUNITY): Payer: Self-pay | Admitting: Gastroenterology

## 2020-06-11 ENCOUNTER — Other Ambulatory Visit: Payer: Self-pay

## 2020-06-11 DIAGNOSIS — Z86010 Personal history of colonic polyps: Secondary | ICD-10-CM

## 2020-06-11 DIAGNOSIS — D120 Benign neoplasm of cecum: Secondary | ICD-10-CM

## 2020-07-19 ENCOUNTER — Other Ambulatory Visit (HOSPITAL_COMMUNITY): Payer: 59

## 2020-07-23 ENCOUNTER — Encounter (HOSPITAL_COMMUNITY): Payer: Self-pay

## 2020-07-23 ENCOUNTER — Ambulatory Visit (HOSPITAL_COMMUNITY): Admit: 2020-07-23 | Payer: 59 | Admitting: Gastroenterology

## 2020-07-23 SURGERY — COLONOSCOPY WITH PROPOFOL
Anesthesia: Monitor Anesthesia Care

## 2020-07-27 DIAGNOSIS — H52213 Irregular astigmatism, bilateral: Secondary | ICD-10-CM | POA: Diagnosis not present

## 2020-07-27 DIAGNOSIS — H524 Presbyopia: Secondary | ICD-10-CM | POA: Diagnosis not present

## 2020-07-27 DIAGNOSIS — H5213 Myopia, bilateral: Secondary | ICD-10-CM | POA: Diagnosis not present

## 2020-10-24 ENCOUNTER — Other Ambulatory Visit (HOSPITAL_COMMUNITY): Payer: Self-pay

## 2020-10-24 MED ORDER — SODIUM FLUORIDE 1.1 % DT GEL
DENTAL | 3 refills | Status: AC
  Filled 2020-10-24 – 2020-11-14 (×2): qty 56, 30d supply, fill #0
  Filled 2021-01-21: qty 56, 30d supply, fill #1

## 2020-10-31 ENCOUNTER — Ambulatory Visit: Payer: 59 | Attending: Internal Medicine

## 2020-10-31 DIAGNOSIS — Z23 Encounter for immunization: Secondary | ICD-10-CM

## 2020-10-31 NOTE — Progress Notes (Signed)
   Covid-19 Vaccination Clinic  Name:  Emily Mejia    MRN: [id number] DOB: [birth year]  10/31/2020  [name] was observed post Covid-19 immunization for 15 minutes without incident. She was provided with Vaccine Information Sheet and instruction to access the V-Safe system.   [name] was instructed to call 911 with any severe reactions post vaccine: Marland Kitchen Difficulty breathing  . Swelling of face and throat  . A fast heartbeat  . A bad rash all over body  . Dizziness and weakness   Immunizations Administered    Name Date Dose VIS Date Route   PFIZER Comrnaty([employer] TOP) Covid-19 Vaccine 10/31/2020 11:06 AM 0.3 mL 05/17/2020 Intramuscular   Manufacturer: [employer], [employer]   Lot: [id number]   NDC: [phone]

## 2020-11-01 ENCOUNTER — Other Ambulatory Visit (HOSPITAL_COMMUNITY): Payer: Self-pay

## 2020-11-01 MED ORDER — COVID-19 MRNA VAC-TRIS(PFIZER) 30 MCG/0.3ML IM SUSP
INTRAMUSCULAR | 0 refills | Status: AC
  Filled 2020-11-01: qty 0.3, 17d supply, fill #0

## 2020-11-02 ENCOUNTER — Other Ambulatory Visit (HOSPITAL_COMMUNITY): Payer: Self-pay

## 2020-11-14 ENCOUNTER — Other Ambulatory Visit (HOSPITAL_COMMUNITY): Payer: Self-pay

## 2020-11-30 ENCOUNTER — Other Ambulatory Visit (HOSPITAL_COMMUNITY): Payer: Self-pay

## 2020-11-30 ENCOUNTER — Other Ambulatory Visit (HOSPITAL_COMMUNITY)
Admission: RE | Admit: 2020-11-30 | Discharge: 2020-11-30 | Disposition: A | Discharge status: Home / Self Care | Payer: 59 | Source: Ambulatory Visit | Attending: Family Medicine | Admitting: Family Medicine

## 2020-11-30 ENCOUNTER — Other Ambulatory Visit: Payer: Self-pay | Admitting: Family Medicine

## 2020-11-30 DIAGNOSIS — N390 Urinary tract infection, site not specified: Secondary | ICD-10-CM | POA: Diagnosis not present

## 2020-11-30 DIAGNOSIS — Z13220 Encounter for screening for lipoid disorders: Secondary | ICD-10-CM | POA: Diagnosis not present

## 2020-11-30 DIAGNOSIS — K580 Irritable bowel syndrome with diarrhea: Secondary | ICD-10-CM | POA: Diagnosis not present

## 2020-11-30 DIAGNOSIS — E669 Obesity, unspecified: Secondary | ICD-10-CM | POA: Diagnosis not present

## 2020-11-30 DIAGNOSIS — Z0000 Encounter for general adult medical examination without abnormal findings: Secondary | ICD-10-CM | POA: Diagnosis not present

## 2020-11-30 DIAGNOSIS — Z124 Encounter for screening for malignant neoplasm of cervix: Secondary | ICD-10-CM | POA: Diagnosis not present

## 2020-11-30 DIAGNOSIS — N951 Menopausal and female climacteric states: Secondary | ICD-10-CM | POA: Diagnosis not present

## 2020-11-30 MED ORDER — VENLAFAXINE HCL ER 37.5 MG PO CP24
ORAL_CAPSULE | ORAL | 1 refills | Status: DC
  Filled 2020-11-30: qty 30, 30d supply, fill #0
  Filled 2020-12-25: qty 30, 30d supply, fill #1

## 2020-12-05 LAB — CYTOLOGY - PAP
Comment: NEGATIVE
Diagnosis: UNDETERMINED — AB
High risk HPV: NEGATIVE

## 2020-12-25 ENCOUNTER — Other Ambulatory Visit (HOSPITAL_COMMUNITY): Payer: Self-pay

## 2021-01-02 ENCOUNTER — Other Ambulatory Visit (HOSPITAL_COMMUNITY): Payer: Self-pay

## 2021-01-02 MED ORDER — VENLAFAXINE HCL ER 37.5 MG PO CP24
ORAL_CAPSULE | ORAL | 0 refills | Status: AC
  Filled 2021-01-02: qty 90, 90d supply, fill #0

## 2021-01-18 ENCOUNTER — Other Ambulatory Visit (HOSPITAL_COMMUNITY): Payer: Self-pay

## 2021-01-21 ENCOUNTER — Other Ambulatory Visit (HOSPITAL_COMMUNITY): Payer: Self-pay

## 2021-01-22 DIAGNOSIS — Z1283 Encounter for screening for malignant neoplasm of skin: Secondary | ICD-10-CM | POA: Diagnosis not present

## 2021-01-22 DIAGNOSIS — X32XXXD Exposure to sunlight, subsequent encounter: Secondary | ICD-10-CM | POA: Diagnosis not present

## 2021-01-22 DIAGNOSIS — L820 Inflamed seborrheic keratosis: Secondary | ICD-10-CM | POA: Diagnosis not present

## 2021-01-22 DIAGNOSIS — D225 Melanocytic nevi of trunk: Secondary | ICD-10-CM | POA: Diagnosis not present

## 2021-01-22 DIAGNOSIS — L570 Actinic keratosis: Secondary | ICD-10-CM | POA: Diagnosis not present

## 2021-03-29 ENCOUNTER — Other Ambulatory Visit: Payer: Self-pay | Admitting: Family Medicine

## 2021-03-29 ENCOUNTER — Telehealth: Payer: Self-pay | Admitting: Internal Medicine

## 2021-03-29 DIAGNOSIS — Z1231 Encounter for screening mammogram for malignant neoplasm of breast: Secondary | ICD-10-CM

## 2021-03-29 NOTE — Telephone Encounter (Signed)
Patient called wanting a referral to another GI specialist (other than Dr. Rush Landmark).  Please call and advise.

## 2021-03-29 NOTE — Telephone Encounter (Signed)
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 She is not satisfied with follow up (she says she did not really have any) and says she and Dr Rush Landmark had differing personalities as well so she would just feel more comfortable seeing someone else.  Please advise.Marland KitchenMarland Kitchen

## 2021-03-29 NOTE — Telephone Encounter (Signed)
Given the possible need for advanced resection technique such as Endo rotor please refer her to Dr. Tillie Rung at [facility] If the lesion is small or absent, then I can perform subsequent surveillance procedures here Thanks JMP

## 2021-03-29 NOTE — Telephone Encounter (Signed)
17 pages of records/referral faxed to Dr Tillie Rung at [facility]. Will await appointment. Patient is advised of this information and is in agreement.

## 2021-04-30 ENCOUNTER — Ambulatory Visit
Admission: RE | Admit: 2021-04-30 | Discharge: 2021-04-30 | Disposition: A | Discharge status: Home / Self Care | Payer: 59 | Source: Ambulatory Visit | Attending: Family Medicine | Admitting: Family Medicine

## 2021-04-30 DIAGNOSIS — Z1231 Encounter for screening mammogram for malignant neoplasm of breast: Secondary | ICD-10-CM | POA: Diagnosis not present

## 2021-05-07 ENCOUNTER — Other Ambulatory Visit (HOSPITAL_COMMUNITY): Payer: Self-pay

## 2021-05-07 MED ORDER — VENLAFAXINE HCL ER 37.5 MG PO CP24
ORAL_CAPSULE | ORAL | 0 refills | Status: DC
  Filled 2021-05-07: qty 30, 30d supply, fill #0

## 2021-05-08 ENCOUNTER — Other Ambulatory Visit (HOSPITAL_COMMUNITY): Payer: Self-pay

## 2021-05-09 ENCOUNTER — Other Ambulatory Visit (HOSPITAL_COMMUNITY): Payer: Self-pay

## 2021-05-09 DIAGNOSIS — D120 Benign neoplasm of cecum: Secondary | ICD-10-CM | POA: Diagnosis not present

## 2021-05-09 MED ORDER — PROMETHAZINE HCL 25 MG PO TABS
ORAL_TABLET | ORAL | 0 refills | Status: AC
  Filled 2021-05-09: qty 30, 7d supply, fill #0

## 2021-05-22 ENCOUNTER — Other Ambulatory Visit (HOSPITAL_COMMUNITY): Payer: Self-pay

## 2021-05-22 DIAGNOSIS — N951 Menopausal and female climacteric states: Secondary | ICD-10-CM | POA: Diagnosis not present

## 2021-05-22 MED ORDER — VENLAFAXINE HCL ER 37.5 MG PO CP24
ORAL_CAPSULE | ORAL | 1 refills | Status: DC
  Filled 2021-05-22 – 2021-06-11 (×2): qty 90, 90d supply, fill #0
  Filled 2021-09-16: qty 90, 90d supply, fill #1

## 2021-06-11 ENCOUNTER — Other Ambulatory Visit (HOSPITAL_COMMUNITY): Payer: Self-pay

## 2021-06-12 ENCOUNTER — Other Ambulatory Visit (HOSPITAL_COMMUNITY): Payer: Self-pay

## 2021-07-08 DIAGNOSIS — Z9889 Other specified postprocedural states: Secondary | ICD-10-CM | POA: Diagnosis not present

## 2021-07-08 DIAGNOSIS — Z86010 Personal history of colonic polyps: Secondary | ICD-10-CM | POA: Diagnosis not present

## 2021-07-08 DIAGNOSIS — D126 Benign neoplasm of colon, unspecified: Secondary | ICD-10-CM | POA: Diagnosis not present

## 2021-07-08 DIAGNOSIS — D120 Benign neoplasm of cecum: Secondary | ICD-10-CM | POA: Diagnosis not present

## 2021-07-30 DIAGNOSIS — H52213 Irregular astigmatism, bilateral: Secondary | ICD-10-CM | POA: Diagnosis not present

## 2021-07-30 DIAGNOSIS — H1789 Other corneal scars and opacities: Secondary | ICD-10-CM | POA: Diagnosis not present

## 2021-07-30 DIAGNOSIS — H25043 Posterior subcapsular polar age-related cataract, bilateral: Secondary | ICD-10-CM | POA: Diagnosis not present

## 2021-07-30 DIAGNOSIS — H43813 Vitreous degeneration, bilateral: Secondary | ICD-10-CM | POA: Diagnosis not present

## 2021-09-16 ENCOUNTER — Other Ambulatory Visit (HOSPITAL_COMMUNITY): Payer: Self-pay

## 2021-10-23 DIAGNOSIS — H25813 Combined forms of age-related cataract, bilateral: Secondary | ICD-10-CM | POA: Diagnosis not present

## 2021-11-11 DIAGNOSIS — H25811 Combined forms of age-related cataract, right eye: Secondary | ICD-10-CM | POA: Diagnosis not present

## 2021-11-11 DIAGNOSIS — H25813 Combined forms of age-related cataract, bilateral: Secondary | ICD-10-CM | POA: Diagnosis not present

## 2021-11-26 DIAGNOSIS — H269 Unspecified cataract: Secondary | ICD-10-CM | POA: Diagnosis not present

## 2021-11-26 DIAGNOSIS — H25811 Combined forms of age-related cataract, right eye: Secondary | ICD-10-CM | POA: Diagnosis not present

## 2021-12-03 ENCOUNTER — Other Ambulatory Visit (HOSPITAL_COMMUNITY): Payer: Self-pay

## 2021-12-03 DIAGNOSIS — N951 Menopausal and female climacteric states: Secondary | ICD-10-CM | POA: Diagnosis not present

## 2021-12-03 DIAGNOSIS — K580 Irritable bowel syndrome with diarrhea: Secondary | ICD-10-CM | POA: Diagnosis not present

## 2021-12-03 DIAGNOSIS — Z0000 Encounter for general adult medical examination without abnormal findings: Secondary | ICD-10-CM | POA: Diagnosis not present

## 2021-12-03 DIAGNOSIS — Z13220 Encounter for screening for lipoid disorders: Secondary | ICD-10-CM | POA: Diagnosis not present

## 2021-12-03 DIAGNOSIS — N390 Urinary tract infection, site not specified: Secondary | ICD-10-CM | POA: Diagnosis not present

## 2021-12-03 DIAGNOSIS — Z23 Encounter for immunization: Secondary | ICD-10-CM | POA: Diagnosis not present

## 2021-12-03 DIAGNOSIS — E669 Obesity, unspecified: Secondary | ICD-10-CM | POA: Diagnosis not present

## 2021-12-03 MED ORDER — VENLAFAXINE HCL ER 37.5 MG PO CP24
ORAL_CAPSULE | ORAL | 3 refills | Status: AC
  Filled 2021-12-03: qty 90, 90d supply, fill #0

## 2022-08-14 DIAGNOSIS — H5213 Myopia, bilateral: Secondary | ICD-10-CM | POA: Diagnosis not present

## 2022-08-14 DIAGNOSIS — H524 Presbyopia: Secondary | ICD-10-CM | POA: Diagnosis not present

## 2022-08-14 DIAGNOSIS — H52213 Irregular astigmatism, bilateral: Secondary | ICD-10-CM | POA: Diagnosis not present

## 2022-08-14 DIAGNOSIS — H43813 Vitreous degeneration, bilateral: Secondary | ICD-10-CM | POA: Diagnosis not present

## 2022-08-15 DIAGNOSIS — H43813 Vitreous degeneration, bilateral: Secondary | ICD-10-CM | POA: Diagnosis not present

## 2022-11-24 DIAGNOSIS — D120 Benign neoplasm of cecum: Secondary | ICD-10-CM | POA: Diagnosis not present

## 2022-11-24 DIAGNOSIS — Z6833 Body mass index (BMI) 33.0-33.9, adult: Secondary | ICD-10-CM | POA: Diagnosis not present

## 2022-11-24 DIAGNOSIS — Z79899 Other long term (current) drug therapy: Secondary | ICD-10-CM | POA: Diagnosis not present

## 2022-11-24 DIAGNOSIS — K635 Polyp of colon: Secondary | ICD-10-CM | POA: Diagnosis not present

## 2022-11-24 DIAGNOSIS — E669 Obesity, unspecified: Secondary | ICD-10-CM | POA: Diagnosis not present

## 2022-11-24 DIAGNOSIS — K648 Other hemorrhoids: Secondary | ICD-10-CM | POA: Diagnosis not present

## 2022-11-24 DIAGNOSIS — K621 Rectal polyp: Secondary | ICD-10-CM | POA: Diagnosis not present

## 2022-12-04 ENCOUNTER — Other Ambulatory Visit: Payer: Self-pay | Admitting: Family Medicine

## 2022-12-04 DIAGNOSIS — Z1231 Encounter for screening mammogram for malignant neoplasm of breast: Secondary | ICD-10-CM

## 2022-12-05 ENCOUNTER — Ambulatory Visit
Admission: RE | Admit: 2022-12-05 | Discharge: 2022-12-05 | Disposition: A | Discharge status: Home / Self Care | Payer: Commercial Managed Care - PPO | Source: Ambulatory Visit | Attending: Family Medicine | Admitting: Family Medicine

## 2022-12-05 DIAGNOSIS — Z1231 Encounter for screening mammogram for malignant neoplasm of breast: Secondary | ICD-10-CM | POA: Diagnosis not present

## 2022-12-17 DIAGNOSIS — E669 Obesity, unspecified: Secondary | ICD-10-CM | POA: Diagnosis not present

## 2022-12-17 DIAGNOSIS — R5383 Other fatigue: Secondary | ICD-10-CM | POA: Diagnosis not present

## 2022-12-17 DIAGNOSIS — K580 Irritable bowel syndrome with diarrhea: Secondary | ICD-10-CM | POA: Diagnosis not present

## 2022-12-17 DIAGNOSIS — Z13220 Encounter for screening for lipoid disorders: Secondary | ICD-10-CM | POA: Diagnosis not present

## 2022-12-17 DIAGNOSIS — N951 Menopausal and female climacteric states: Secondary | ICD-10-CM | POA: Diagnosis not present

## 2022-12-17 DIAGNOSIS — Z0000 Encounter for general adult medical examination without abnormal findings: Secondary | ICD-10-CM | POA: Diagnosis not present

## 2023-01-12 ENCOUNTER — Other Ambulatory Visit (HOSPITAL_COMMUNITY): Payer: Self-pay

## 2023-01-12 MED ORDER — VENLAFAXINE HCL ER 37.5 MG PO CP24
37.5000 mg | ORAL_CAPSULE | Freq: Every day | ORAL | 3 refills | Status: DC
  Filled 2023-01-12: qty 90, 90d supply, fill #0
  Filled 2023-04-07: qty 90, 90d supply, fill #1
  Filled 2023-07-06: qty 90, 90d supply, fill #2

## 2023-02-18 DIAGNOSIS — D120 Benign neoplasm of cecum: Secondary | ICD-10-CM | POA: Diagnosis not present

## 2023-02-19 ENCOUNTER — Other Ambulatory Visit: Payer: Self-pay

## 2023-02-19 ENCOUNTER — Other Ambulatory Visit (HOSPITAL_COMMUNITY): Payer: Self-pay

## 2023-02-19 MED ORDER — NEOMYCIN SULFATE 500 MG PO TABS
ORAL_TABLET | ORAL | 0 refills | Status: AC
  Filled 2023-02-19: qty 6, 1d supply, fill #0

## 2023-02-19 MED ORDER — METRONIDAZOLE 500 MG PO TABS
ORAL_TABLET | ORAL | 0 refills | Status: AC
  Filled 2023-02-19: qty 3, 1d supply, fill #0

## 2023-03-02 DIAGNOSIS — D225 Melanocytic nevi of trunk: Secondary | ICD-10-CM | POA: Diagnosis not present

## 2023-03-02 DIAGNOSIS — A630 Anogenital (venereal) warts: Secondary | ICD-10-CM | POA: Diagnosis not present

## 2023-03-02 DIAGNOSIS — L821 Other seborrheic keratosis: Secondary | ICD-10-CM | POA: Diagnosis not present

## 2023-03-02 DIAGNOSIS — L820 Inflamed seborrheic keratosis: Secondary | ICD-10-CM | POA: Diagnosis not present

## 2023-03-02 DIAGNOSIS — D1801 Hemangioma of skin and subcutaneous tissue: Secondary | ICD-10-CM | POA: Diagnosis not present

## 2023-03-03 DIAGNOSIS — D120 Benign neoplasm of cecum: Secondary | ICD-10-CM | POA: Diagnosis not present

## 2023-04-07 ENCOUNTER — Other Ambulatory Visit: Payer: Self-pay

## 2023-04-23 DIAGNOSIS — D120 Benign neoplasm of cecum: Secondary | ICD-10-CM | POA: Diagnosis not present

## 2023-05-15 IMAGING — MG MM DIGITAL SCREENING BILAT W/ TOMO AND CAD
8 series · 9 of 24 positions shown · non-contrast
Comparison: Previous exam(s).

CLINICAL DATA: Screening.

EXAM:
DIGITAL SCREENING BILATERAL MAMMOGRAM WITH TOMOSYNTHESIS AND CAD
TECHNIQUE: Bilateral screening digital craniocaudal and mediolateral oblique
mammograms were obtained. Bilateral screening digital breast
tomosynthesis was performed. The images were evaluated with
computer-aided detection.

[L CC synth-2D]
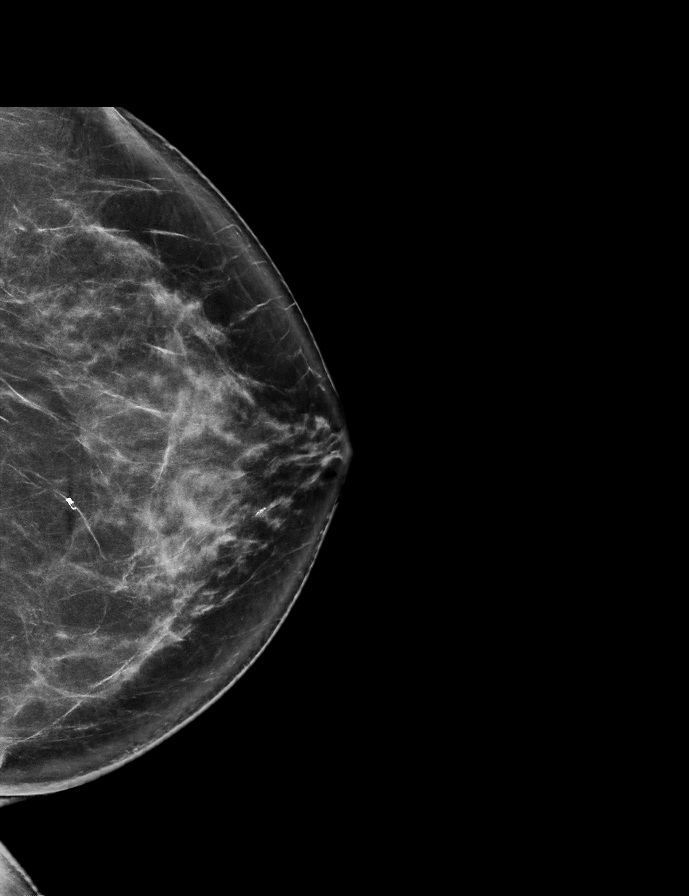

[R MLO synth-2D]
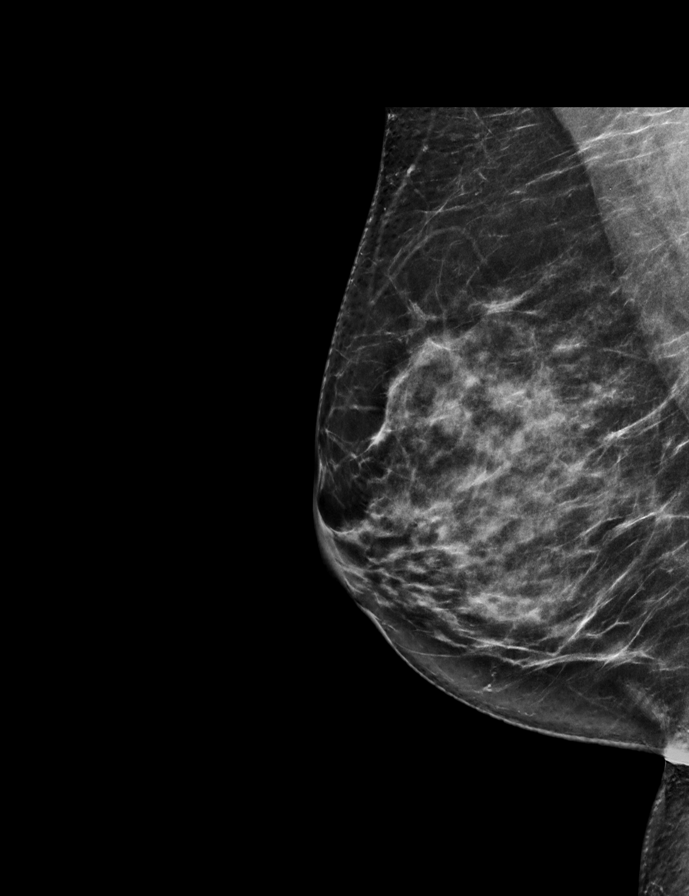

[L MLO synth-2D]
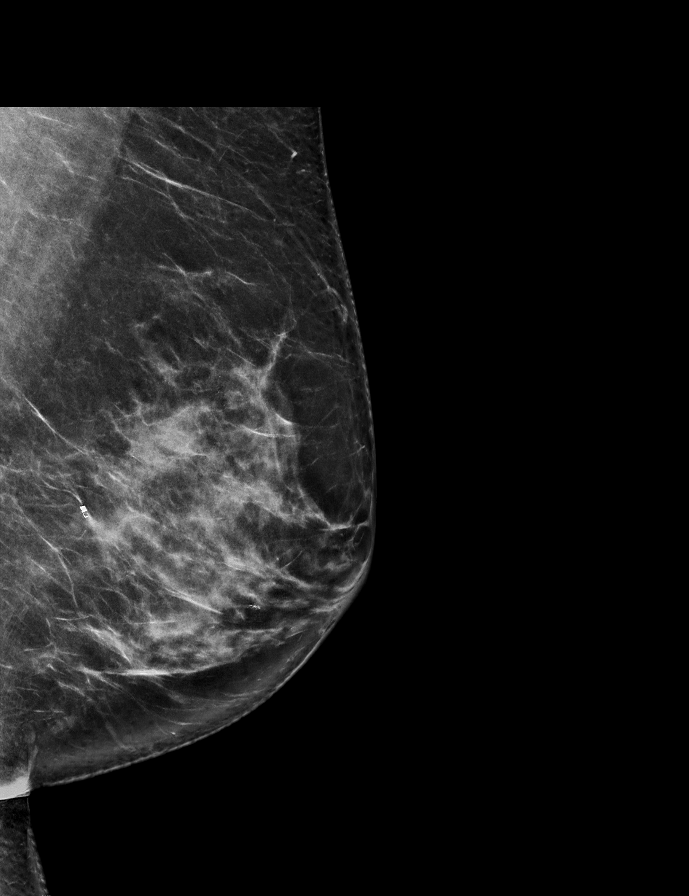

[R CC synth-2D]
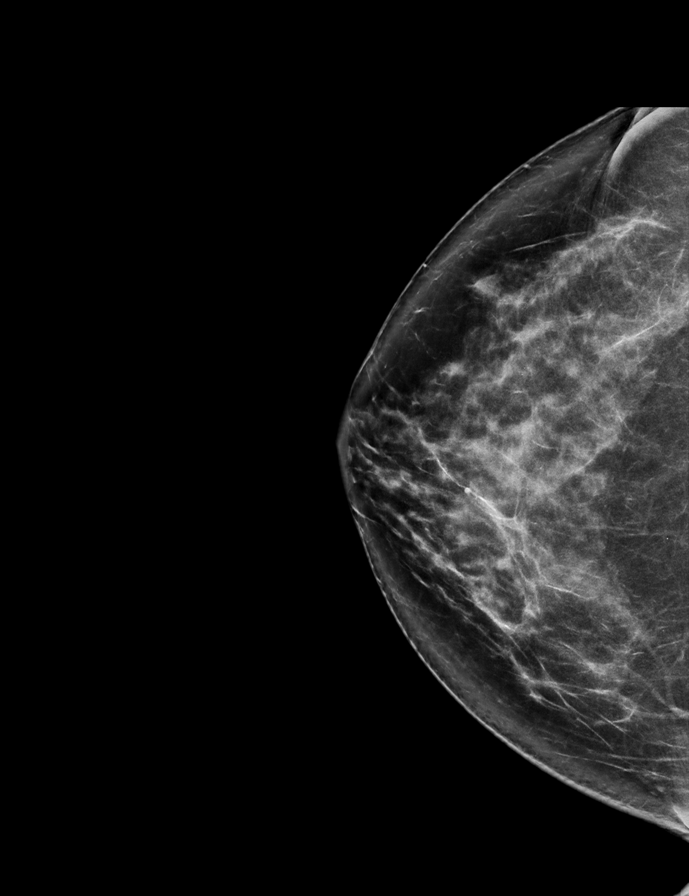

[L MLO tomo · 2 of 79 frames shown]
[frame 26/79]
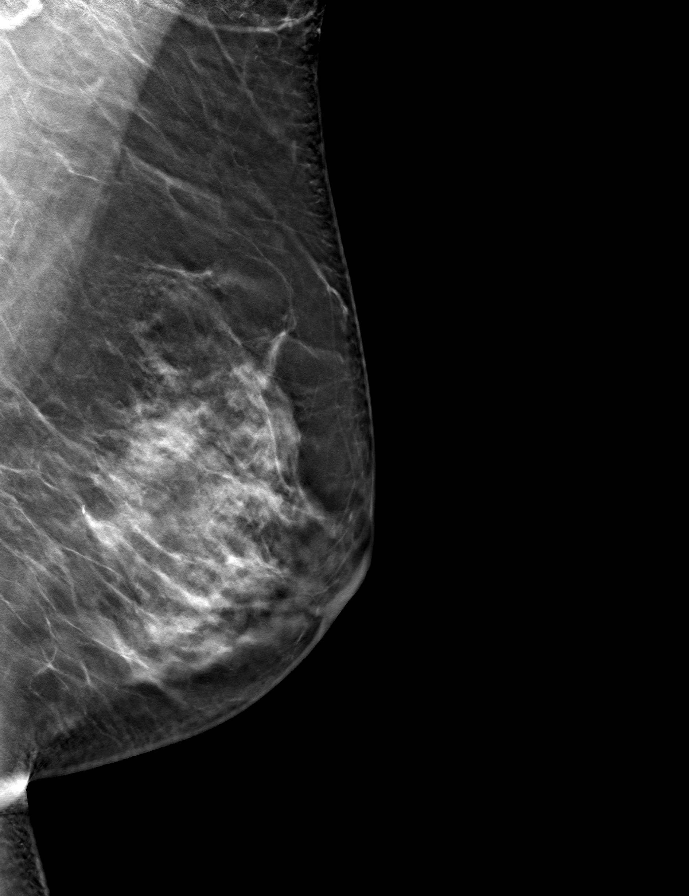
[frame 40/79]
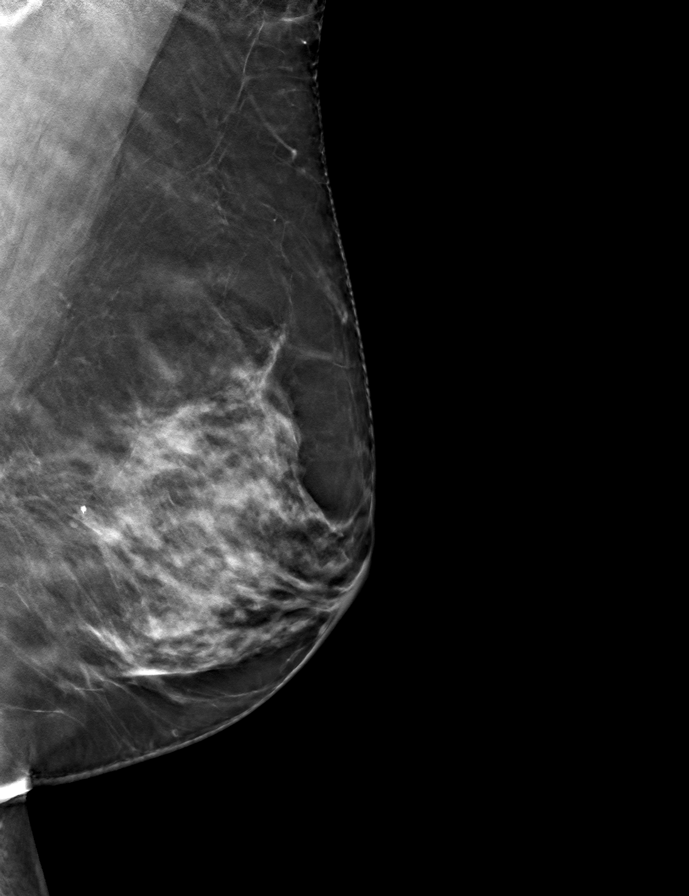

[L CC tomo · tomo slice 41/80.0]
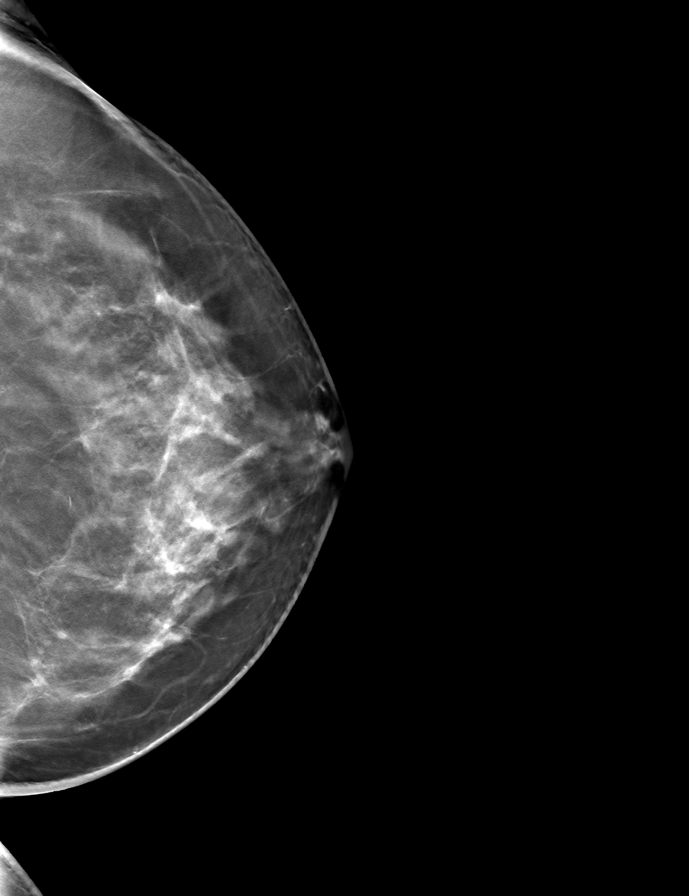

[R CC tomo · tomo slice 39/78.0]
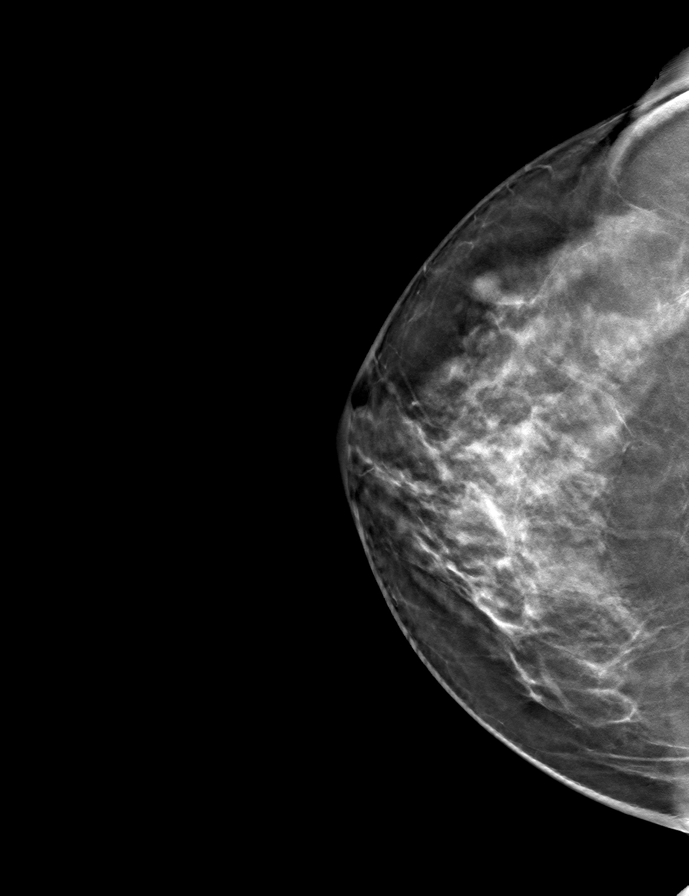

[R MLO tomo · tomo slice 36/71.0]
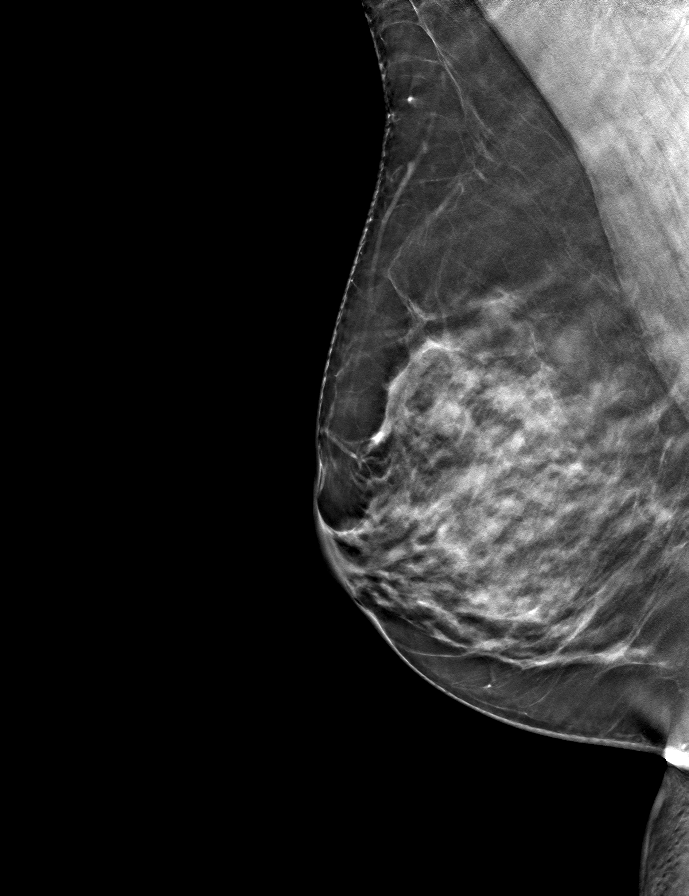

[9 of 24 positions shown; findings below may reference images not displayed]

ACR Breast Density Category c: The breast tissue is heterogeneously
dense, which may obscure small masses.
FINDINGS: There are no findings suspicious for malignancy.
IMPRESSION: No mammographic evidence of malignancy. A result letter of this
screening mammogram will be mailed directly to the patient.

RECOMMENDATION:
Screening mammogram in one year. (Code:Q3-W-BC3)

BI-RADS CATEGORY  1: Negative.

## 2023-07-06 ENCOUNTER — Other Ambulatory Visit: Payer: Self-pay

## 2023-07-06 ENCOUNTER — Other Ambulatory Visit (HOSPITAL_COMMUNITY): Payer: Self-pay

## 2024-01-15 DIAGNOSIS — E669 Obesity, unspecified: Secondary | ICD-10-CM | POA: Diagnosis not present

## 2024-01-15 DIAGNOSIS — K635 Polyp of colon: Secondary | ICD-10-CM | POA: Diagnosis not present

## 2024-01-15 DIAGNOSIS — Z0000 Encounter for general adult medical examination without abnormal findings: Secondary | ICD-10-CM | POA: Diagnosis not present

## 2024-01-15 DIAGNOSIS — Z124 Encounter for screening for malignant neoplasm of cervix: Secondary | ICD-10-CM | POA: Diagnosis not present

## 2024-01-15 DIAGNOSIS — N951 Menopausal and female climacteric states: Secondary | ICD-10-CM | POA: Diagnosis not present

## 2024-01-15 DIAGNOSIS — Z13220 Encounter for screening for lipoid disorders: Secondary | ICD-10-CM | POA: Diagnosis not present

## 2024-01-15 DIAGNOSIS — E559 Vitamin D deficiency, unspecified: Secondary | ICD-10-CM | POA: Diagnosis not present

## 2024-01-15 DIAGNOSIS — E2839 Other primary ovarian failure: Secondary | ICD-10-CM | POA: Diagnosis not present

## 2024-01-15 DIAGNOSIS — K580 Irritable bowel syndrome with diarrhea: Secondary | ICD-10-CM | POA: Diagnosis not present

## 2024-01-22 ENCOUNTER — Other Ambulatory Visit: Payer: Self-pay

## 2024-01-22 ENCOUNTER — Other Ambulatory Visit (HOSPITAL_COMMUNITY): Payer: Self-pay

## 2024-01-22 MED ORDER — VENLAFAXINE HCL ER 37.5 MG PO CP24
37.5000 mg | ORAL_CAPSULE | Freq: Every day | ORAL | 3 refills | Status: AC
  Filled 2024-01-22: qty 90, 90d supply, fill #0

## 2024-02-10 ENCOUNTER — Other Ambulatory Visit (HOSPITAL_BASED_OUTPATIENT_CLINIC_OR_DEPARTMENT_OTHER): Payer: Self-pay

## 2024-02-10 MED ORDER — FLUZONE 0.5 ML IM SUSY
0.5000 mL | PREFILLED_SYRINGE | Freq: Once | INTRAMUSCULAR | 0 refills | Status: AC
  Filled 2024-02-10: qty 0.5, 1d supply, fill #0

## 2024-02-18 DIAGNOSIS — H52223 Regular astigmatism, bilateral: Secondary | ICD-10-CM | POA: Diagnosis not present

## 2024-02-18 DIAGNOSIS — H524 Presbyopia: Secondary | ICD-10-CM | POA: Diagnosis not present

## 2024-02-18 DIAGNOSIS — H5210 Myopia, unspecified eye: Secondary | ICD-10-CM | POA: Diagnosis not present

## 2024-04-27 DIAGNOSIS — C44622 Squamous cell carcinoma of skin of right upper limb, including shoulder: Secondary | ICD-10-CM | POA: Diagnosis not present

## 2024-05-25 DIAGNOSIS — Z08 Encounter for follow-up examination after completed treatment for malignant neoplasm: Secondary | ICD-10-CM | POA: Diagnosis not present

## 2024-05-25 DIAGNOSIS — Z85828 Personal history of other malignant neoplasm of skin: Secondary | ICD-10-CM | POA: Diagnosis not present
